# Patient Record
Sex: Female | Born: 1965 | Race: White | Hispanic: No | Marital: Married | Smoking: Former smoker
Health system: Southern US, Community
[De-identification: ages and names within clinical notes are randomized; demographics above are authoritative.]

## PROBLEM LIST (undated history)

## (undated) DIAGNOSIS — G4730 Sleep apnea, unspecified: Secondary | ICD-10-CM

## (undated) DIAGNOSIS — F419 Anxiety disorder, unspecified: Secondary | ICD-10-CM

## (undated) DIAGNOSIS — T7840XA Allergy, unspecified, initial encounter: Secondary | ICD-10-CM

## (undated) HISTORY — DX: Sleep apnea, unspecified: G47.30

## (undated) HISTORY — DX: Anxiety disorder, unspecified: F41.9

## (undated) HISTORY — DX: Allergy, unspecified, initial encounter: T78.40XA

---

## 1998-07-01 ENCOUNTER — Other Ambulatory Visit: Admission: RE | Admit: 1998-07-01 | Discharge: 1998-07-01 | Payer: Self-pay | Admitting: Obstetrics and Gynecology

## 2000-02-13 HISTORY — PX: DILATION AND EVACUATION: SHX1459

## 2000-03-25 ENCOUNTER — Ambulatory Visit (HOSPITAL_COMMUNITY): Admission: RE | Admit: 2000-03-25 | Discharge: 2000-03-25 | Payer: Self-pay | Admitting: Obstetrics and Gynecology

## 2000-03-25 ENCOUNTER — Encounter (INDEPENDENT_AMBULATORY_CARE_PROVIDER_SITE_OTHER): Payer: Self-pay | Admitting: Specialist

## 2000-03-30 ENCOUNTER — Inpatient Hospital Stay (HOSPITAL_COMMUNITY): Admission: AD | Admit: 2000-03-30 | Discharge: 2000-03-30 | Payer: Self-pay | Admitting: Obstetrics and Gynecology

## 2000-03-30 ENCOUNTER — Encounter: Payer: Self-pay | Admitting: Obstetrics and Gynecology

## 2000-10-25 ENCOUNTER — Other Ambulatory Visit: Admission: RE | Admit: 2000-10-25 | Discharge: 2000-10-25 | Payer: Self-pay | Admitting: Obstetrics and Gynecology

## 2001-04-18 ENCOUNTER — Inpatient Hospital Stay (HOSPITAL_COMMUNITY): Admission: AD | Admit: 2001-04-18 | Discharge: 2001-04-18 | Payer: Self-pay | Admitting: Obstetrics and Gynecology

## 2001-05-12 ENCOUNTER — Inpatient Hospital Stay (HOSPITAL_COMMUNITY): Admission: AD | Admit: 2001-05-12 | Discharge: 2001-05-12 | Payer: Self-pay | Admitting: Obstetrics & Gynecology

## 2001-05-14 ENCOUNTER — Inpatient Hospital Stay (HOSPITAL_COMMUNITY): Admission: AD | Admit: 2001-05-14 | Discharge: 2001-05-17 | Payer: Self-pay | Admitting: Obstetrics and Gynecology

## 2001-05-14 ENCOUNTER — Encounter (INDEPENDENT_AMBULATORY_CARE_PROVIDER_SITE_OTHER): Payer: Self-pay

## 2001-06-17 ENCOUNTER — Other Ambulatory Visit: Admission: RE | Admit: 2001-06-17 | Discharge: 2001-06-17 | Payer: Self-pay | Admitting: Obstetrics and Gynecology

## 2001-12-31 ENCOUNTER — Encounter (INDEPENDENT_AMBULATORY_CARE_PROVIDER_SITE_OTHER): Payer: Self-pay | Admitting: Specialist

## 2001-12-31 ENCOUNTER — Ambulatory Visit (HOSPITAL_COMMUNITY): Admission: RE | Admit: 2001-12-31 | Discharge: 2001-12-31 | Payer: Self-pay | Admitting: General Surgery

## 2002-07-23 ENCOUNTER — Other Ambulatory Visit: Admission: RE | Admit: 2002-07-23 | Discharge: 2002-07-23 | Payer: Self-pay | Admitting: Obstetrics and Gynecology

## 2005-10-27 ENCOUNTER — Inpatient Hospital Stay (HOSPITAL_COMMUNITY): Admission: AD | Admit: 2005-10-27 | Discharge: 2005-10-29 | Payer: Self-pay | Admitting: Obstetrics and Gynecology

## 2005-10-31 ENCOUNTER — Ambulatory Visit: Admission: RE | Admit: 2005-10-31 | Discharge: 2005-10-31 | Payer: Self-pay | Admitting: Obstetrics and Gynecology

## 2007-02-13 HISTORY — PX: CYST EXCISION: SHX5701

## 2007-02-26 ENCOUNTER — Encounter: Admission: RE | Admit: 2007-02-26 | Discharge: 2007-02-26 | Payer: Self-pay | Admitting: Obstetrics and Gynecology

## 2007-04-22 ENCOUNTER — Ambulatory Visit (HOSPITAL_COMMUNITY): Admission: RE | Admit: 2007-04-22 | Discharge: 2007-04-22 | Payer: Self-pay | Admitting: Family Medicine

## 2008-03-05 ENCOUNTER — Encounter: Admission: RE | Admit: 2008-03-05 | Discharge: 2008-03-05 | Payer: Self-pay | Admitting: Obstetrics and Gynecology

## 2008-03-11 ENCOUNTER — Encounter: Admission: RE | Admit: 2008-03-11 | Discharge: 2008-03-11 | Payer: Self-pay | Admitting: Obstetrics and Gynecology

## 2009-03-08 ENCOUNTER — Encounter: Admission: RE | Admit: 2009-03-08 | Discharge: 2009-03-08 | Payer: Self-pay | Admitting: Obstetrics and Gynecology

## 2009-05-18 ENCOUNTER — Ambulatory Visit (HOSPITAL_COMMUNITY): Admission: RE | Admit: 2009-05-18 | Discharge: 2009-05-18 | Payer: Self-pay | Admitting: Family Medicine

## 2010-03-05 ENCOUNTER — Encounter: Payer: Self-pay | Admitting: Obstetrics and Gynecology

## 2010-06-30 NOTE — H&P (Signed)
[facility]

## 2010-06-30 NOTE — H&P (Signed)
Virginia Black, Virginia Black                  ACCOUNT NO.:  [id number]   MEDICAL RECORD NO.:  [id number]          PATIENT TYPE:  INP   LOCATION:  9168                          FACILITY:  [facility]   PHYSICIAN:  Lenoard Aden, M.D.DATE OF BIRTH:  1965-08-02   DATE OF ADMISSION:  10/27/2005  DATE OF DISCHARGE:                                HISTORY & PHYSICAL   CHIEF COMPLAINT:  Presumed macrosomia for induction.   HISTORY OF PRESENT ILLNESS:  She is a 45 year old white female, G3, P1, at  38-2/7 weeks with an ultrasound estimated fetal weight to be over nine  pounds, favorable cervix for induction.   MEDICATIONS:  1. Prenatal vitamins.  2. Lexapro.  3. Zyrtec.  4. Occasional use of Extra Strength Tylenol.   ALLERGIES:  No known drug allergies.   PAST MEDICAL HISTORY:  1. History of spontaneous vaginal delivery x1.  2. History of missed AB with D&E x1.   FAMILY HISTORY:  Noncontributory.   SOCIAL HISTORY:  She is a nonsmoker, nondrinker.  She denies domestic or  physical violence.  Pregnancy course otherwise uncomplicated.   PHYSICAL EXAMINATION:  GENERAL:  She is a well-developed, well-nourished  white female in no acute distress.  HEENT:  Normal.  LUNGS:  Clear.  HEART:  Regular rate and rhythm.  ABDOMEN:  Soft, gravid, nontender.  Estimated fetal weight approximately  eight pounds by Leopold's, nine to 9.5 by ultrasound.  PELVIC:  Cervix is 2 to 3 cm, 70%, vertex, -1.  EXTREMITIES:  No cords.  NEUROLOGIC:  Nonfocal.   LABORATORY DATA:  Fetal heart rate tracing is reactive.   IMPRESSION:  1. A 38 week intrauterine pregnancy.  2. Presumed large for gestational age with favorable cervix for induction.   PLAN:  Proceed with Pitocin augmentation and attempted vaginal delivery.  Risks and benefits discussed.      Lenoard Aden, M.D.  Electronically Signed     RJT/MEDQ  D:  10/27/2005  T:  10/27/2005  Job:  [id number]   cc:   Floyde Parkins

## 2010-06-30 NOTE — Op Note (Signed)
[facility]  Patient:    Virginia Black, Virginia Black Visit Number: [id number] MRN: [id number]          Service Type: OBS Location: [facility] Attending Physician:  Genia Del Dictated by:   Lenoard Aden, M.D. Proc. Date: 05/15/01 Admit Date:  05/12/2001                             Operative Report  INDICATIONS:                  Maternal exhaustion, prolonged pushing efforts x2 1/2 hours.  POSTOPERATIVE DIAGNOSES:      Maternal exhaustion, prolonged pushing efforts x2 1/2 hours.  PROCEDURE:                    Outlet vacuum assisted vaginal delivery.  DESCRIPTION OF PROCEDURE:     After being apprised of the risks, benefits of vacuum assisted vaginal delivery, fetal positioning is confirmed at LOA less than 45 degrees +3/+4 station.  MityVac mushroom cup applied for four gentle pulls over a second degree midline episiotomy for atraumatic delivery of a full-term living female, Apgars 8 and 9.  No lacerations are noted.  Placenta is delivered spontaneously intact.  Bulb suction is performed on perineum. Cervix without lacerations.  Vagina without lacerations.  Repaired with a 3-0 Vicryl Rapide without difficulty.  Estimated blood loss 600 cc.  Uterine atone controlled with bimanual massage.  Patient tolerates procedure well.  Placenta is inspected and found to be complete.  Mother and baby recovering in good condition. Dictated by:   Lenoard Aden, M.D. Attending Physician:  Genia Del DD:  05/15/01 TD:  05/15/01 Job: [id number]

## 2010-06-30 NOTE — Op Note (Signed)
[facility]  Patient:    Virginia Black, Virginia Black                         MRN: [id number] Adm. Date:  [id number] Attending:  Silverio Lay A                           Operative Report  PREOPERATIVE DIAGNOSIS:       Missed abortion.  POSTOPERATIVE DIAGNOSIS:      Missed abortion.  PROCEDURE:                    Dilation and evacuation.  SURGEON:                      Silverio Lay, M.D.  ANESTHESIA:                   MAC plus paracervical block.  ESTIMATED BLOOD LOSS:         Minimal.  DESCRIPTION OF PROCEDURE:     After being informed of the planned procedure with the possible complications including bleeding, infection, uterine perforation, the need for laparoscopy, need for laparotomy and retained products of conception needing a second procedure, informed consent was obtained.  The patient was taken to OR #3 and given IV sedation.  She was placed in the lithotomy position, prepped and draped in a sterile fashion. GYN exam revealed a retroverted uterus compatible with a ten-week pregnancy, two normal adnexa.  A weighted speculum was inserted and the anterior lip of the cervix was grasped with a tenaculum.  The uterus was sounded at 11.5 cm. The cervix was dilated using Hegar dilators at #33.  Using a #9 curved cannula the uterine contents were evacuated with suction.  After suction was completed, a sharp curet was used to assess the uterine walls, which were felt to be free of remaining tissue as well as both cornua being free.  The instruments were then removed.  Estimated blood loss was minimal.  The procedure was very well tolerated by the patient.  She was taken to the recovery room in well and stable condition. DD:  03/25/00 TD:  03/26/00 Job: [id number]

## 2010-12-14 ENCOUNTER — Ambulatory Visit (HOSPITAL_COMMUNITY)
Admission: RE | Admit: 2010-12-14 | Discharge: 2010-12-14 | Disposition: A | Discharge status: Home / Self Care | Payer: 59 | Source: Ambulatory Visit | Attending: Family Medicine | Admitting: Family Medicine

## 2010-12-14 ENCOUNTER — Other Ambulatory Visit (HOSPITAL_COMMUNITY): Payer: Self-pay | Admitting: Family Medicine

## 2010-12-14 DIAGNOSIS — S022XXA Fracture of nasal bones, initial encounter for closed fracture: Secondary | ICD-10-CM | POA: Insufficient documentation

## 2010-12-14 DIAGNOSIS — X58XXXA Exposure to other specified factors, initial encounter: Secondary | ICD-10-CM | POA: Insufficient documentation

## 2010-12-14 DIAGNOSIS — R51 Headache: Secondary | ICD-10-CM | POA: Insufficient documentation

## 2011-04-10 ENCOUNTER — Other Ambulatory Visit: Payer: Self-pay | Admitting: Obstetrics and Gynecology

## 2011-04-10 DIAGNOSIS — Z1231 Encounter for screening mammogram for malignant neoplasm of breast: Secondary | ICD-10-CM

## 2011-04-24 ENCOUNTER — Ambulatory Visit
Admission: RE | Admit: 2011-04-24 | Discharge: 2011-04-24 | Disposition: A | Discharge status: Home / Self Care | Payer: 59 | Source: Ambulatory Visit | Attending: Obstetrics and Gynecology | Admitting: Obstetrics and Gynecology

## 2011-04-24 DIAGNOSIS — Z1231 Encounter for screening mammogram for malignant neoplasm of breast: Secondary | ICD-10-CM

## 2011-04-25 ENCOUNTER — Other Ambulatory Visit: Payer: Self-pay | Admitting: Obstetrics and Gynecology

## 2011-04-25 DIAGNOSIS — R928 Other abnormal and inconclusive findings on diagnostic imaging of breast: Secondary | ICD-10-CM

## 2011-05-01 ENCOUNTER — Ambulatory Visit
Admission: RE | Admit: 2011-05-01 | Discharge: 2011-05-01 | Disposition: A | Discharge status: Home / Self Care | Payer: 59 | Source: Ambulatory Visit | Attending: Obstetrics and Gynecology | Admitting: Obstetrics and Gynecology

## 2011-05-01 DIAGNOSIS — R928 Other abnormal and inconclusive findings on diagnostic imaging of breast: Secondary | ICD-10-CM

## 2012-08-19 ENCOUNTER — Other Ambulatory Visit: Payer: Self-pay

## 2012-08-19 DIAGNOSIS — Z1231 Encounter for screening mammogram for malignant neoplasm of breast: Secondary | ICD-10-CM

## 2012-09-09 ENCOUNTER — Ambulatory Visit
Admission: RE | Admit: 2012-09-09 | Discharge: 2012-09-09 | Disposition: A | Discharge status: Home / Self Care | Payer: BC Managed Care – PPO | Source: Ambulatory Visit

## 2012-09-09 DIAGNOSIS — Z1231 Encounter for screening mammogram for malignant neoplasm of breast: Secondary | ICD-10-CM

## 2012-09-18 ENCOUNTER — Other Ambulatory Visit (HOSPITAL_COMMUNITY): Payer: Self-pay

## 2012-09-18 DIAGNOSIS — G4730 Sleep apnea, unspecified: Secondary | ICD-10-CM

## 2012-10-03 ENCOUNTER — Ambulatory Visit: Payer: BC Managed Care – PPO | Attending: Neurology | Admitting: Sleep Medicine

## 2012-10-03 DIAGNOSIS — G4733 Obstructive sleep apnea (adult) (pediatric): Secondary | ICD-10-CM | POA: Insufficient documentation

## 2012-10-03 DIAGNOSIS — G4730 Sleep apnea, unspecified: Secondary | ICD-10-CM

## 2012-10-10 NOTE — Procedures (Signed)
[facility] Virginia Black A. Gerilyn Pilgrim, MD     [email or web address]        [name], [name]                  ACCOUNT NO.:  [id number]  MEDICAL RECORD NO.:  [id number]          PATIENT TYPE:  OUT  LOCATION:  SLEEP LAB                     FACILITY:  [facility]  PHYSICIAN:  Willie Loy A. Gerilyn Pilgrim, M.D. DATE OF BIRTH:  [birth year]  DATE OF STUDY:  10/03/2012                           NOCTURNAL POLYSOMNOGRAM  REFERRING PHYSICIAN:  Corrie Mckusick, M.D.  INDICATION FOR STUDY:  A 47 year old who presents with hypersomnia, fatigue, and snoring.  The study is being done to evaluate for obstructive sleep apnea syndrome.  EPWORTH SLEEPINESS SCORE:  14.  BMI 29.  MEDICATIONS:  None.  SLEEP ARCHITECTURE:  The total recording time is 404 minutes, sleep efficiency is 90%, sleep latency 27 minutes, REM latency 144 minutes. Stage N1 1%, N2 54%, N3 25%, and REM sleep 28%.  RESPIRATORY DATA:  Baseline oxygen saturation is 97, lowest saturation 85 during non-REM sleep.  Diagnostic AHI is 23 and RDI 23.  CARDIAC DATA:  Average heart rate is 70 with no significant dysrhythmias observed.  MOVEMENT-PARASOMNIA:  PLM index 0.  IMPRESSIONS-RECOMMENDATIONS:  Moderate obstructive sleep apnea syndrome. We recommend formal CPAP titration recording.  Thanks for this referral.     Kevin Space A. Gerilyn Pilgrim, M.D.    KAD/MEDQ  D:  10/10/2012 19:44:42  T:  10/10/2012 19:53:55  Job:  [id number]

## 2012-11-07 ENCOUNTER — Other Ambulatory Visit: Payer: Self-pay

## 2012-11-07 DIAGNOSIS — G4730 Sleep apnea, unspecified: Secondary | ICD-10-CM

## 2012-12-05 ENCOUNTER — Ambulatory Visit: Payer: BC Managed Care – PPO | Attending: Neurology | Admitting: Sleep Medicine

## 2012-12-05 DIAGNOSIS — G4730 Sleep apnea, unspecified: Secondary | ICD-10-CM

## 2012-12-05 DIAGNOSIS — G4733 Obstructive sleep apnea (adult) (pediatric): Secondary | ICD-10-CM | POA: Insufficient documentation

## 2012-12-10 NOTE — Procedures (Signed)
[facility] [name] Gerilyn Pilgrim, MD     [email or web address]        Virginia Black, Virginia Black                  ACCOUNT NO.:  [id number]  MEDICAL RECORD NO.:  [id number]          PATIENT TYPE:  OUT  LOCATION:  SLEEP LAB                     FACILITY:  [facility]  PHYSICIAN:  Kele Withem A. Gerilyn Pilgrim, M.D. DATE OF BIRTH:  Nov 23, 1965  DATE OF STUDY:  12/05/2012                           NOCTURNAL POLYSOMNOGRAM  REFERRING PHYSICIAN:  Jasamine Pottinger A. Gerilyn Pilgrim, M.D.  INDICATION:  A 47 year old who has a known history of obstructive sleep apnea syndrome.  This is a CPAP titration recording.  MEDICATIONS:  None.  EPWORTH SLEEPINESS SCALE:  16.  BMI:  29.  ARCHITECTURAL SUMMARY:  The total recording time is 413 minutes.  Sleep efficiency 90%, sleep latency 37 minutes.  REM latency 107 minutes. Stage N1 1%, N2 47%, N3 27% and REM sleep 24%.  RESPIRATORY SUMMARY:  Baseline oxygen saturation is 97, lowest saturation 92 during REM sleep.  The patient was placed on positive pressure starting at 5 and increased to 7, optimal pressure 7 with resolution of events and good tolerance.  LIMB MOVEMENT SUMMARY:  PLM index is 0.  ELECTROCARDIOGRAM SUMMARY:  Average heart rate is 71 with no significant dysrhythmias observed.  IMPRESSION:  Obstructive sleep apnea syndrome which responds well to a CPAP of 7.     Haide Klinker A. Gerilyn Pilgrim, M.D.    KAD/MEDQ  D:  12/10/2012 10:54:20  T:  12/10/2012 11:10:06  Job:  [id number]

## 2014-02-17 ENCOUNTER — Other Ambulatory Visit: Payer: Self-pay

## 2014-02-17 DIAGNOSIS — Z1231 Encounter for screening mammogram for malignant neoplasm of breast: Secondary | ICD-10-CM

## 2014-06-04 ENCOUNTER — Ambulatory Visit
Admission: RE | Admit: 2014-06-04 | Discharge: 2014-06-04 | Disposition: A | Discharge status: Home / Self Care | Payer: BLUE CROSS/BLUE SHIELD | Source: Ambulatory Visit

## 2014-06-04 DIAGNOSIS — Z1231 Encounter for screening mammogram for malignant neoplasm of breast: Secondary | ICD-10-CM

## 2015-10-01 DIAGNOSIS — H524 Presbyopia: Secondary | ICD-10-CM | POA: Diagnosis not present

## 2016-01-04 DIAGNOSIS — G4733 Obstructive sleep apnea (adult) (pediatric): Secondary | ICD-10-CM | POA: Diagnosis not present

## 2016-01-25 ENCOUNTER — Encounter: Payer: BLUE CROSS/BLUE SHIELD | Admitting: Pediatrics

## 2016-01-26 ENCOUNTER — Ambulatory Visit (INDEPENDENT_AMBULATORY_CARE_PROVIDER_SITE_OTHER): Payer: BLUE CROSS/BLUE SHIELD | Admitting: Pediatrics

## 2016-01-26 ENCOUNTER — Encounter: Payer: Self-pay | Admitting: Pediatrics

## 2016-01-26 VITALS — BP 109/69 | HR 67 | Temp 97.5°F | Ht 68.0 in | Wt 196.0 lb

## 2016-01-26 DIAGNOSIS — Z0000 Encounter for general adult medical examination without abnormal findings: Secondary | ICD-10-CM | POA: Diagnosis not present

## 2016-01-26 DIAGNOSIS — Z23 Encounter for immunization: Secondary | ICD-10-CM

## 2016-01-26 DIAGNOSIS — F419 Anxiety disorder, unspecified: Secondary | ICD-10-CM

## 2016-01-26 MED ORDER — BUPROPION HCL ER (XL) 150 MG PO TB24: 150.0000 mg | ORAL_TABLET | Freq: Every day | ORAL | 11 refills | Status: DC

## 2016-01-26 MED ORDER — ESCITALOPRAM OXALATE 20 MG PO TABS: 20.0000 mg | ORAL_TABLET | Freq: Every day | ORAL | 11 refills | Status: DC

## 2016-01-26 NOTE — Progress Notes (Signed)
  Subjective:   Patient ID: Virginia Black, female    DOB: [birth year], 50 y.o.   MRN: [id number] CC: New Patient (Initial Visit)  HPI: Virginia Black is a 50 y.o. female presenting for New Patient (Initial Visit)  OSA: CPAP daily, has helped a lot  Anxiety: on lexapro, has been for six months, has been helping a lot Tried celexa in the past, caused weight gain Thinks it has been helping well Was also on wellbutrin at the same time at one point, felt calmer when on both  No chest pain, no SOB Starting to have irregular periods hasnt had period in 3 mo No hot flashes  Pap smear over a year ago, has never had abnormals, gets regularly  Mammogram done at breast center in [place]  Family History  Problem Relation Age of Onset  . Hyperlipidemia Mother   . Hyperlipidemia Father   no fam hx of breast ca, colon ca MGM in CVA Mother: high BP Maternal Aunt: Dm2  Social History   Social History  . Marital status: Married    Spouse name: N/A  . Number of children: N/A  . Years of education: N/A   Social History Main Topics  . Smoking status: Never Smoker  . Smokeless tobacco: Never Used  . Alcohol use 4.8 oz/week    8 Glasses of wine per week  . Drug use: No  . Sexual activity: Yes   Other Topics Concern  . None   Social History Narrative  . None   ROS: All systems negative other than what is in HPI  Objective:    BP 109/69   Pulse 67   Temp 97.5 F (36.4 C) (Oral)   Ht '5\' 8"'$  (1.727 m)   Wt 196 lb (88.9 kg)   BMI 29.80 kg/m   Wt Readings from Last 3 Encounters:  01/26/16 196 lb (88.9 kg)    Gen: NAD, alert, cooperative with exam, NCAT EYES: EOMI, no conjunctival injection, or no icterus ENT:  TMs pearly gray b/l, OP without erythema LYMPH: no cervical LAD CV: NRRR, normal S1/S2, no murmur, distal pulses 2+ b/l Resp: CTABL, no wheezes, normal WOB Abd: +BS, soft, NTND. no guarding or organomegaly Ext: No edema, warm Neuro: Alert and oriented, strength equal b/l UE  and LE, coordination grossly normal MSK: normal muscle bulk  Assessment & Plan:  Virginia Black was seen today for new patient (initial visit).  Diagnoses and all orders for this visit:  Encounter for preventive health examination -     Ambulatory referral to Gastroenterology in [place] for colonoscopy -     CMP14+EGFR -     Lipid panel -     CBC with Differential/Platelet  Anxiety Felt better when on both wellbutrin and lexapro May be able to decrease lexapro dose to '10mg'$ , had fewere sexual side effects then as well Restart wellbutrin Decrease to '10mg'$  lexapro if able -     buPROPion (WELLBUTRIN XL) 150 MG 24 hr tablet; Take 1 tablet (150 mg total) by mouth daily. -     escitalopram (LEXAPRO) 20 MG tablet; Take 1 tablet (20 mg total) by mouth daily.  Encounter for immunization -     Flu Vaccine QUAD 36+ mos IM   Follow up plan: 1 yr Assunta Found, MD [place]

## 2016-01-27 LAB — CBC WITH DIFFERENTIAL/PLATELET
Basophils Absolute: 0.1 10*3/uL (ref 0.0–0.2)
Basos: 1 %
EOS (ABSOLUTE): 0.3 10*3/uL (ref 0.0–0.4)
Eos: 3 %
Hematocrit: 39.8 % (ref 34.0–46.6)
Hemoglobin: 13.7 g/dL (ref 11.1–15.9)
Immature Grans (Abs): 0 10*3/uL (ref 0.0–0.1)
Immature Granulocytes: 0 %
Lymphocytes Absolute: 2.6 10*3/uL (ref 0.7–3.1)
Lymphs: 26 %
MCH: 29.7 pg (ref 26.6–33.0)
MCHC: 34.4 g/dL (ref 31.5–35.7)
MCV: 86 fL (ref 79–97)
Monocytes Absolute: 0.8 10*3/uL (ref 0.1–0.9)
Monocytes: 8 %
Neutrophils Absolute: 6.3 10*3/uL (ref 1.4–7.0)
Neutrophils: 62 %
Platelets: 330 10*3/uL (ref 150–379)
RBC: 4.62 x10E6/uL (ref 3.77–5.28)
RDW: 14.5 % (ref 12.3–15.4)
WBC: 10 10*3/uL (ref 3.4–10.8)

## 2016-01-27 LAB — LIPID PANEL
Chol/HDL Ratio: 2.9 ratio units (ref 0.0–4.4)
Cholesterol, Total: 229 mg/dL — ABNORMAL HIGH (ref 100–199)
HDL: 79 mg/dL (ref >39)
LDL Calculated: 126 mg/dL — ABNORMAL HIGH (ref 0–99)
Triglycerides: 121 mg/dL (ref 0–149)
VLDL Cholesterol Cal: 24 mg/dL (ref 5–40)

## 2016-01-27 LAB — CMP14+EGFR
ALT: 15 IU/L (ref 0–32)
AST: 17 IU/L (ref 0–40)
Albumin/Globulin Ratio: 1.5 (ref 1.2–2.2)
Albumin: 4.3 g/dL (ref 3.5–5.5)
Alkaline Phosphatase: 82 IU/L (ref 39–117)
BUN/Creatinine Ratio: 19 (ref 9–23)
BUN: 13 mg/dL (ref 6–24)
Bilirubin Total: 0.4 mg/dL (ref 0.0–1.2)
CO2: 28 mmol/L (ref 18–29)
Calcium: 8.8 mg/dL (ref 8.7–10.2)
Chloride: 99 mmol/L (ref 96–106)
Creatinine, Ser: 0.69 mg/dL (ref 0.57–1.00)
GFR calc Af Amer: 117 mL/min/{1.73_m2} (ref >59)
GFR calc non Af Amer: 102 mL/min/{1.73_m2} (ref >59)
Globulin, Total: 2.9 g/dL (ref 1.5–4.5)
Glucose: 86 mg/dL (ref 65–99)
Potassium: 4.7 mmol/L (ref 3.5–5.2)
Sodium: 144 mmol/L (ref 134–144)
Total Protein: 7.2 g/dL (ref 6.0–8.5)

## 2016-06-23 ENCOUNTER — Encounter: Payer: Self-pay | Admitting: Nurse Practitioner

## 2016-06-23 ENCOUNTER — Ambulatory Visit (INDEPENDENT_AMBULATORY_CARE_PROVIDER_SITE_OTHER): Payer: BLUE CROSS/BLUE SHIELD | Admitting: Nurse Practitioner

## 2016-06-23 VITALS — BP 102/60 | HR 72 | Temp 97.7°F | Ht 68.0 in | Wt 202.6 lb

## 2016-06-23 DIAGNOSIS — H6123 Impacted cerumen, bilateral: Secondary | ICD-10-CM

## 2016-06-23 NOTE — Patient Instructions (Signed)
Earwax Buildup Your ears make a substance called earwax. It may also be called cerumen. Sometimes, too much earwax builds up in your ear canal. This can cause ear pain and make it harder for you to hear. CAUSES This condition is caused by too much earwax production or buildup. RISK FACTORS The following factors may make you more likely to develop this condition:  Cleaning your ears often with swabs.  Having narrow ear canals.  Having earwax that is overly thick or sticky.  Having eczema.  Being dehydrated. SYMPTOMS Symptoms of this condition include:  Reduced hearing.  Ear drainage.  Ear pain.  Ear itch.  A feeling of fullness in the ear or feeling that the ear is plugged.  Ringing in the ear.  Coughing. DIAGNOSIS Your health care provider can diagnose this condition based on your symptoms and medical history. Your health care provider will also do an ear exam to look inside your ear with a scope (otoscope). You may also have a hearing test. TREATMENT Treatment for this condition includes:  Over-the-counter or prescription ear drops to soften the earwax.  Earwax removal by a health care provider. This may be done:  By flushing the ear with body-temperature water.  With a medical instrument that has a loop at the end (earwax curette).  With a suction device. HOME CARE INSTRUCTIONS  Take over-the-counter and prescription medicines only as told by your health care provider.  Do not put any objects, including an ear swab, into your ear. You can clean the opening of your ear canal with a washcloth.  Drink enough water to keep your urine clear or pale yellow.  If you have frequent earwax buildup or you use hearing aids, consider seeing your health care provider every 6-12 months for routine preventive ear cleanings. Keep all follow-up visits as told by your health care provider. SEEK MEDICAL CARE IF:  You have ear pain.  Your condition does not improve with  treatment.  You have hearing loss.  You have blood, pus, or other fluid coming from your ear. This information is not intended to replace advice given to you by your health care provider. Make sure you discuss any questions you have with your health care provider. Document Released: 03/08/2004 Document Revised: 05/23/2015 Document Reviewed: 09/15/2014 Elsevier Interactive Patient Education  2017 [employer].

## 2016-06-23 NOTE — Progress Notes (Signed)
   Subjective:    Patient ID: Virginia Black, female    DOB: 1965-10-24, 51 y.o.   MRN: [id number]  HPI  Patient comes in today c/o left ear pain and feels clogged up. Started about 3 days ago. Hearing has decreased some.   Review of Systems  Constitutional: Negative.   HENT: Positive for ear pain. Negative for ear discharge.   Respiratory: Negative.   Gastrointestinal: Negative.   Genitourinary: Negative.   Neurological: Negative.   Psychiatric/Behavioral: Negative.   All other systems reviewed and are negative.      Objective:   Physical Exam  Constitutional: She is oriented to person, place, and time. She appears well-developed and well-nourished. No distress.  HENT:  Right Ear: A foreign body (cerumen impaction) is present.  Left Ear: A foreign body (cerumen impaction) is present.  Nose: Nose normal. No mucosal edema or rhinorrhea. Right sinus exhibits no maxillary sinus tenderness and no frontal sinus tenderness. Left sinus exhibits no maxillary sinus tenderness and no frontal sinus tenderness.  Mouth/Throat: Uvula is midline, oropharynx is clear and moist and mucous membranes are normal.  Cardiovascular: Normal rate and regular rhythm.   Pulmonary/Chest: Effort normal and breath sounds normal.  Neurological: She is alert and oriented to person, place, and time.  Skin: Skin is warm.   BP 102/60   Pulse 72   Temp 97.7 F (36.5 C) (Oral)   Ht 5\' 8"  (1.727 m)   Wt 202 lb 9.6 oz (91.9 kg)   BMI 30.81 kg/m   S/P bil ear irrigation TM's mild clear effusion- no erythema or Gerre Scull, FNP       Assessment & Plan:   1. Bilateral hearing loss due to cerumen impaction    Ear irrigation dbrox 2-3 x a week OTC decongestant RTO prn  Mary-Margaret Hassell Done, FNP

## 2016-07-12 ENCOUNTER — Telehealth: Payer: Self-pay | Admitting: Pediatrics

## 2016-10-18 IMAGING — MG MM SCREEN MAMMOGRAM BILATERAL
4 series · 4 of 4 positions shown · non-contrast
Comparison: Previous exam(s).

CLINICAL DATA: Screening.

EXAM:
DIGITAL SCREENING BILATERAL MAMMOGRAM WITH CAD

[R CC]
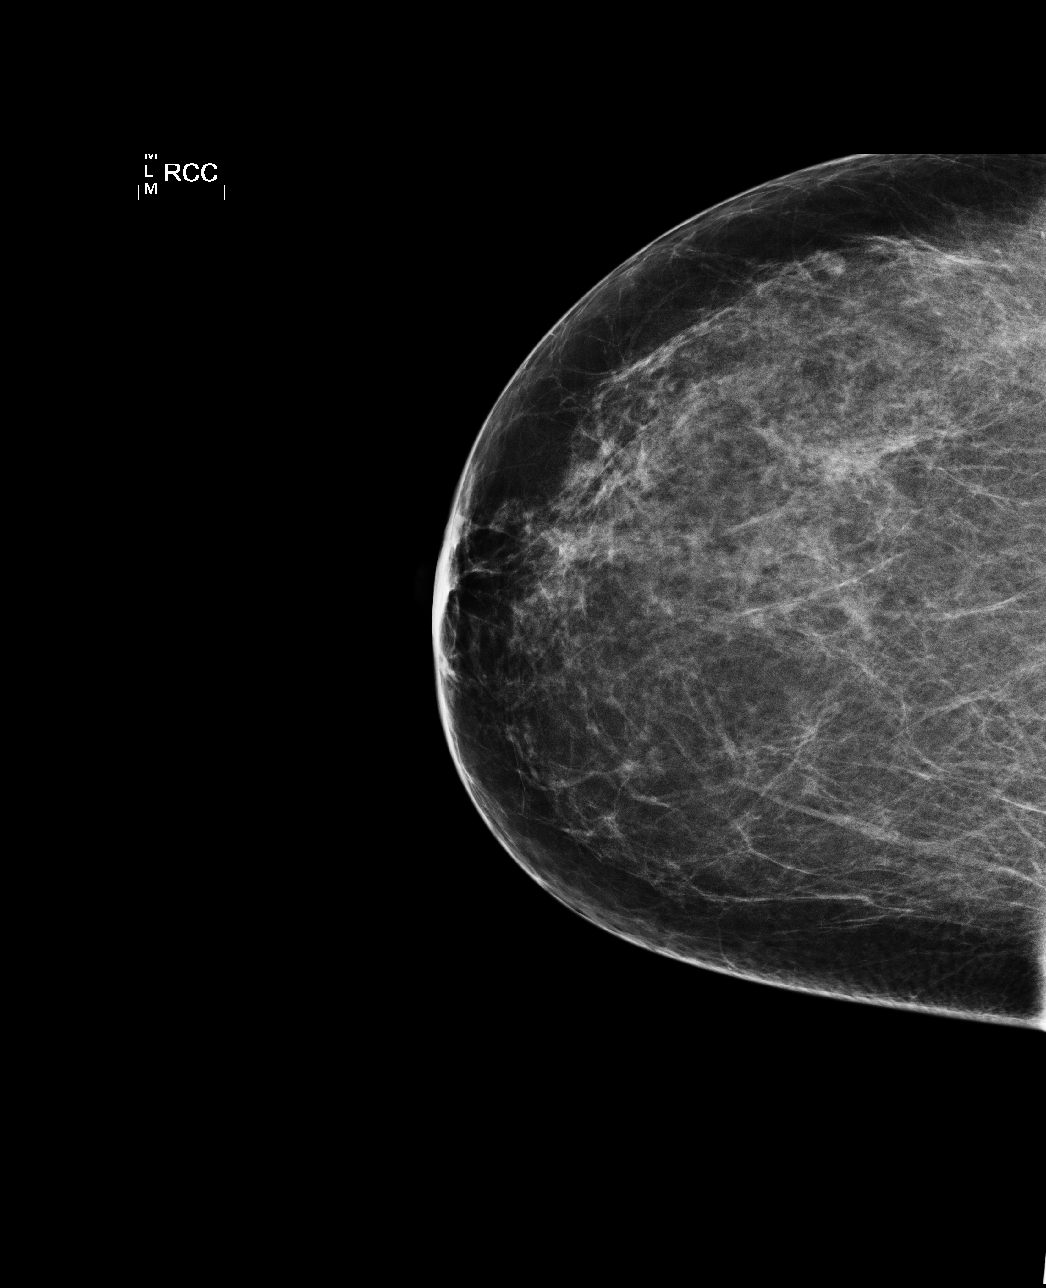

[L CC]
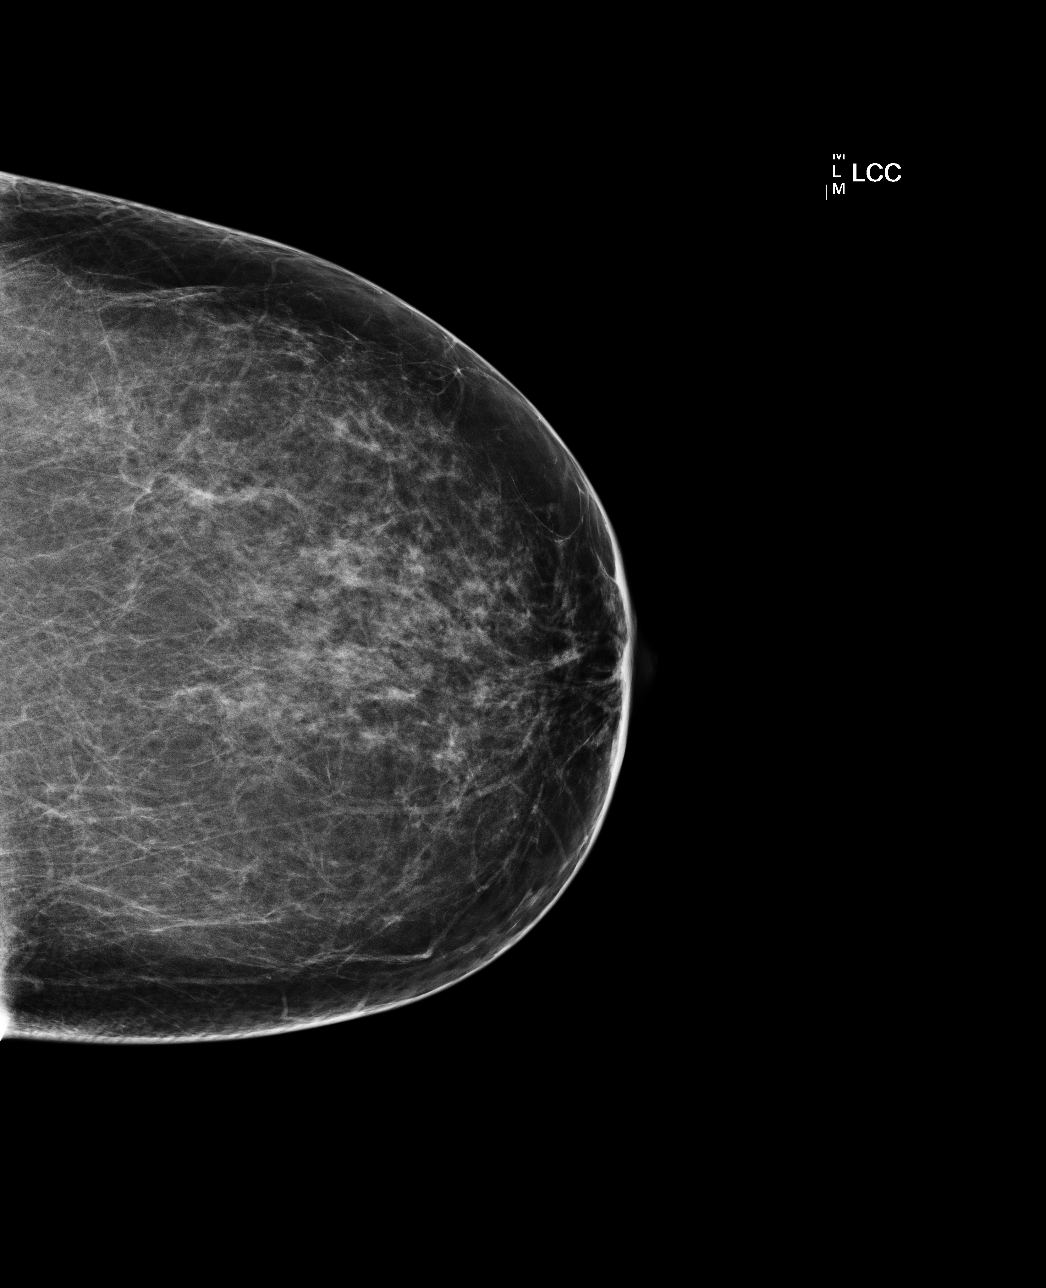

[L MLO]
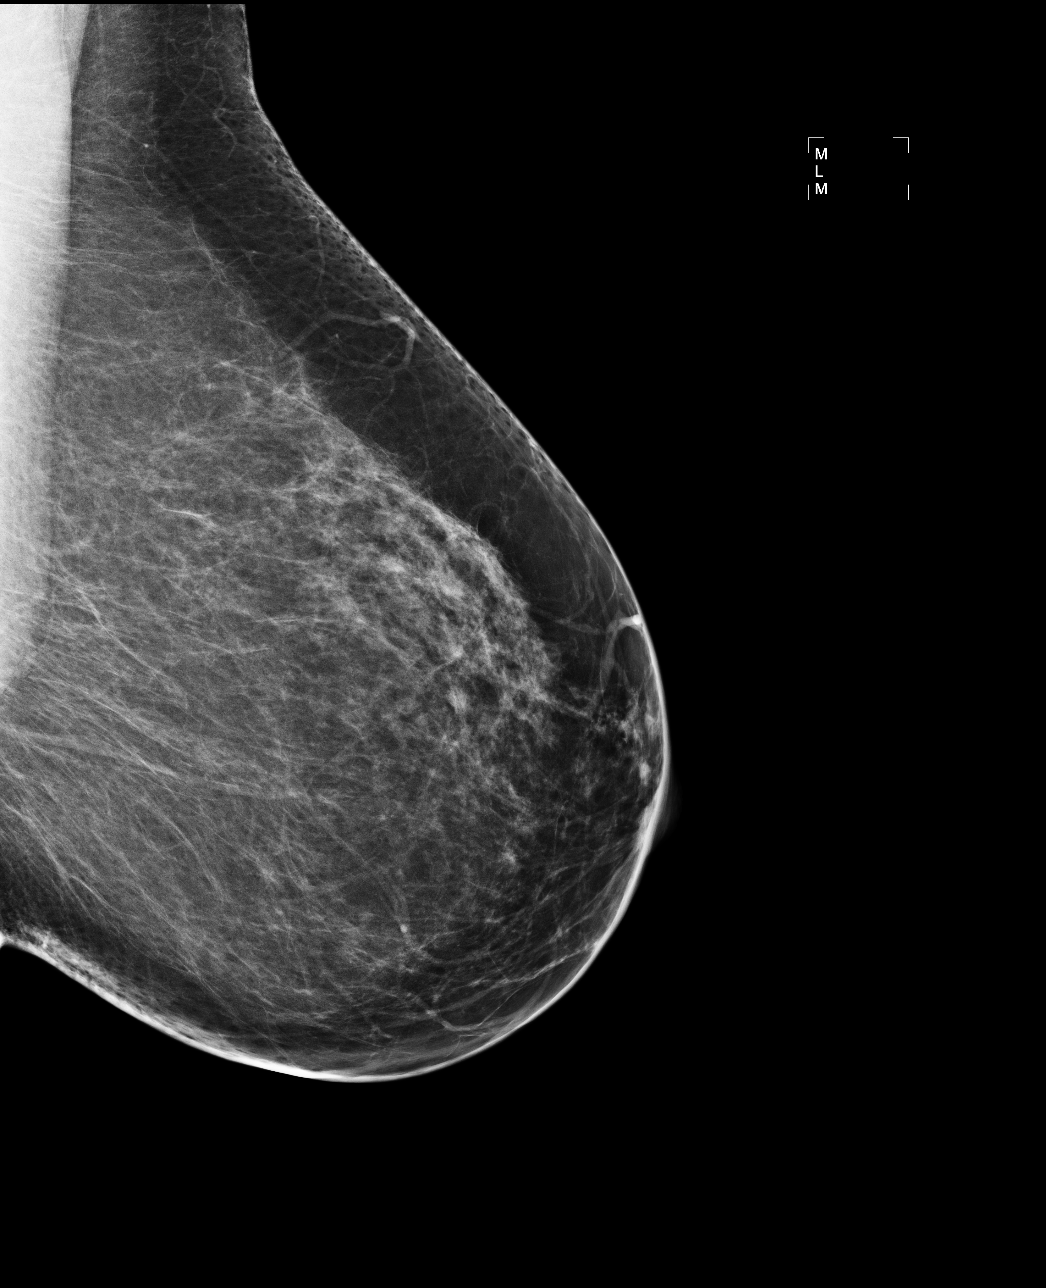

[R MLO]
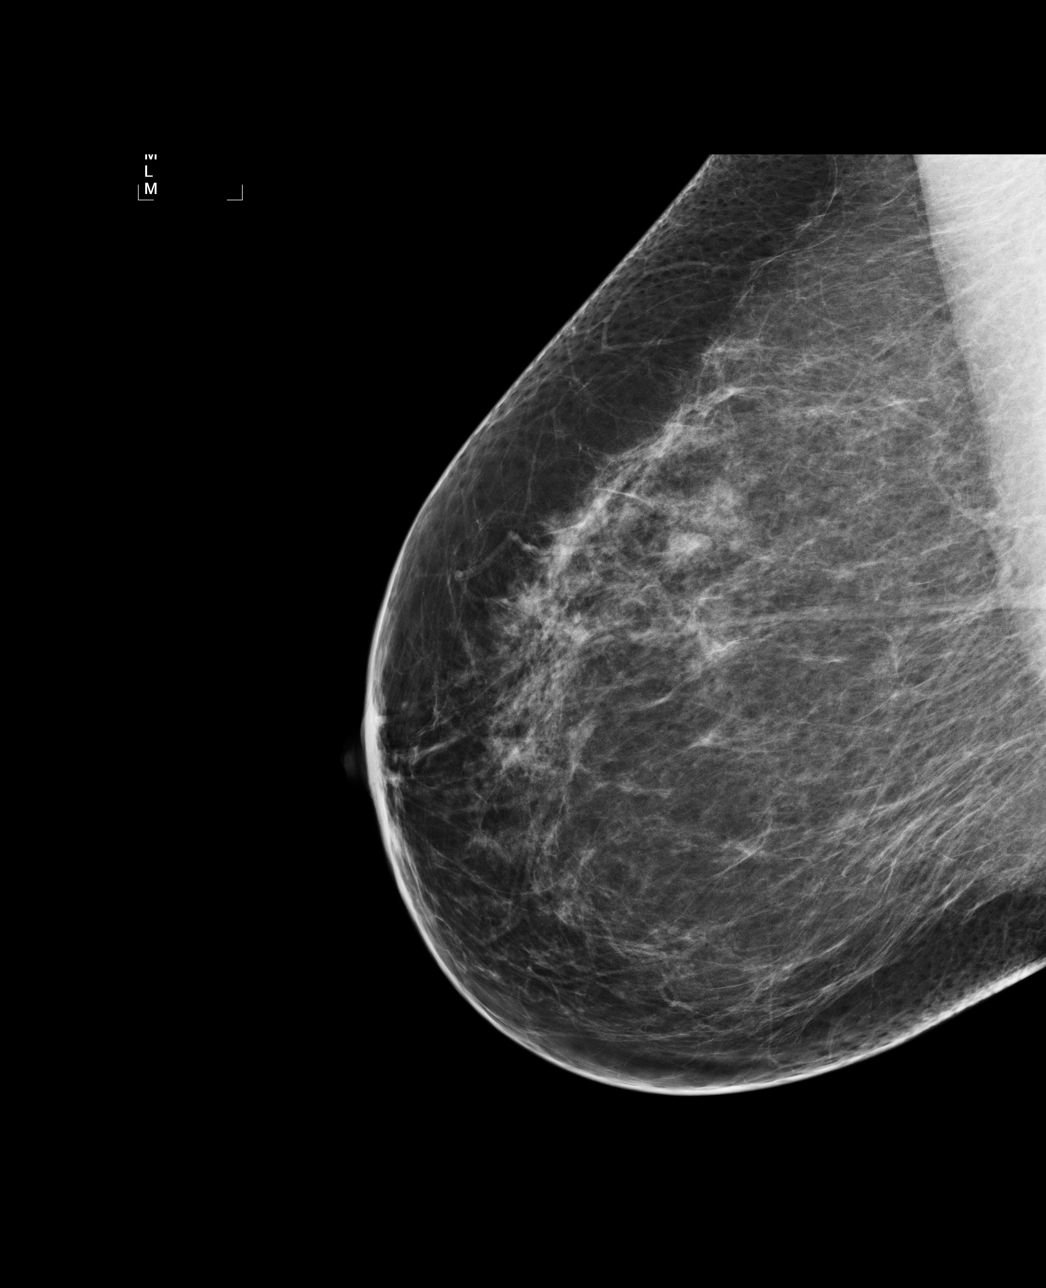

[4 of 4 positions shown; findings below may reference images not displayed]

ACR Breast Density Category c: The breast tissue is heterogeneously
dense, which may obscure small masses.
FINDINGS: There are no findings suspicious for malignancy. Images were
processed with CAD.
IMPRESSION: No mammographic evidence of malignancy. A result letter of this
screening mammogram will be mailed directly to the patient.

RECOMMENDATION:
Screening mammogram in one year. (Code:YJ-[id number])

BI-RADS CATEGORY  1: Negative.

## 2017-01-01 DIAGNOSIS — Z1231 Encounter for screening mammogram for malignant neoplasm of breast: Secondary | ICD-10-CM | POA: Diagnosis not present

## 2017-01-28 ENCOUNTER — Encounter (INDEPENDENT_AMBULATORY_CARE_PROVIDER_SITE_OTHER): Payer: Self-pay

## 2017-01-28 ENCOUNTER — Encounter: Payer: Self-pay | Admitting: Pediatrics

## 2017-01-28 ENCOUNTER — Ambulatory Visit (INDEPENDENT_AMBULATORY_CARE_PROVIDER_SITE_OTHER): Payer: BLUE CROSS/BLUE SHIELD | Admitting: Pediatrics

## 2017-01-28 VITALS — BP 116/73 | HR 69 | Temp 97.1°F | Ht 68.0 in | Wt 200.6 lb

## 2017-01-28 DIAGNOSIS — G4700 Insomnia, unspecified: Secondary | ICD-10-CM

## 2017-01-28 DIAGNOSIS — E669 Obesity, unspecified: Secondary | ICD-10-CM

## 2017-01-28 DIAGNOSIS — Z6830 Body mass index (BMI) 30.0-30.9, adult: Secondary | ICD-10-CM | POA: Diagnosis not present

## 2017-01-28 DIAGNOSIS — F339 Major depressive disorder, recurrent, unspecified: Secondary | ICD-10-CM

## 2017-01-28 DIAGNOSIS — Z23 Encounter for immunization: Secondary | ICD-10-CM

## 2017-01-28 DIAGNOSIS — M79671 Pain in right foot: Secondary | ICD-10-CM

## 2017-01-28 DIAGNOSIS — Z0000 Encounter for general adult medical examination without abnormal findings: Secondary | ICD-10-CM

## 2017-01-28 DIAGNOSIS — Z1211 Encounter for screening for malignant neoplasm of colon: Secondary | ICD-10-CM

## 2017-01-28 MED ORDER — DULOXETINE HCL 20 MG PO CPEP: 20.0000 mg | ORAL_CAPSULE | Freq: Every day | ORAL | 2 refills | Status: DC

## 2017-01-28 NOTE — Progress Notes (Signed)
Subjective:   Patient ID: Virginia Black, female    DOB: [birth year], 51 y.o.   MRN: [id number] CC: Annual Exam and multiple med problems. HPI: Virginia Black is a 51 y.o. female presenting for Annual Exam  Younger son has ADHD, ongoing stress at home Moods up and down, no periods for over a year Intercourse once a month, no pain, seeing counselor starting this past week, low libido On lexapro now, patient does not think that it is helping much Going to sleep 11-12a, up at [place] of the family goes to bed by 10 Patient says she stays up watching TV because she cannot get to sleep  Last mammogram done within the last month with [facility]  Due for colonoscopy  No fam h/o colon or breast  Has been bothered by right heel pain over the last 4 months Limits her ability to walk She put better arch supports in her shoes that she wears every day with some improvement, still enough pain and discomfort that she is not able to regularly exercise Using tennis ball on her arch, trying to stretch her plantar fascia  Depression screen [place] 2/9 01/28/2017 06/23/2016 01/26/2016  Decreased Interest 0 0 0  Down, Depressed, Hopeless 0 0 0  PHQ - 2 Score 0 0 0     Relevant past medical, surgical, family and social history reviewed. Allergies and medications reviewed and updated. Social History   Tobacco Use  Smoking Status Never Smoker  Smokeless Tobacco Never Used   ROS: All systems negative other than what is in the HPI  Objective:    BP 116/73   Pulse 69   Temp (!) 97.1 F (36.2 C) (Oral)   Ht 5\' 8"  (1.727 m)   Wt 200 lb 9.6 oz (91 kg)   BMI 30.50 kg/m   Wt Readings from Last 3 Encounters:  01/28/17 200 lb 9.6 oz (91 kg)  06/23/16 202 lb 9.6 oz (91.9 kg)  01/26/16 196 lb (88.9 kg)    Gen: NAD, alert, cooperative with exam, NCAT EYES: EOMI, no conjunctival injection, or no icterus ENT:  TMs pearly gray b/l, OP without erythema LYMPH: no cervical LAD CV: NRRR, normal S1/S2, no  murmur, distal pulses 2+ b/l Resp: CTABL, no wheezes, normal WOB Abd: +BS, soft, NTND. no guarding or organomegaly Ext: No edema, warm Neuro: Alert and oriented, strength equal b/l UE and LE, coordination grossly normal MSK: Right foot with high arches, normal range of motion, no swelling, no pain with Achilles tendon squeeze, pain with lower heel squeeze, tender insertion of plantar fascia right foot Psych: Normal affect  Assessment & Plan:  Virginia Black was seen today for annual exam and multiple medical problems  Diagnoses and all orders for this visit:  Encounter for preventive health examination Recent mammogram within the right breast here at clinic, not yet in the computer Patient reports his normal Due for Pap smear next summer for patient  Depression, recurrent ([place]) Decreased libido with the Lexapro 10mg , will try switching to Cymbalta Mood has been down In counseling, couple's counseling now but she thinks they will be meeting individually with a counselor in the future -     DULoxetine (CYMBALTA) 20 MG capsule; Take 1 capsule (20 mg total) by mouth daily.  Colon cancer screening -     Ambulatory referral to Gastroenterology  Pain of right heel -     Ambulatory referral to Podiatry  Need for immunization against influenza -     Flu  Vaccine QUAD 36+ mos IM  Insomnia, unspecified type Discussed sleep hygiene, turning screens off 1-2 hours before going to bed Okay to try melatonin We will try to get heel pain improved so patient can regularly exercise again  Class 1 obesity without serious comorbidity with body mass index (BMI) of 30.0 to 30.9 in adult, unspecified obesity type Discussed lifestyle changes, patient mostly eating at home, avoiding sugary foods Increase physical activity as able  Follow up plan: 3 mo, sooner prn Assunta Found, MD [place]

## 2017-02-02 ENCOUNTER — Other Ambulatory Visit: Payer: BLUE CROSS/BLUE SHIELD

## 2017-02-02 ENCOUNTER — Other Ambulatory Visit: Payer: Self-pay | Admitting: Pediatrics

## 2017-02-02 DIAGNOSIS — Z0000 Encounter for general adult medical examination without abnormal findings: Secondary | ICD-10-CM

## 2017-02-02 DIAGNOSIS — F419 Anxiety disorder, unspecified: Secondary | ICD-10-CM

## 2017-02-03 LAB — CMP14+EGFR
ALT: 16 IU/L (ref 0–32)
AST: 17 IU/L (ref 0–40)
Albumin/Globulin Ratio: 1.6 (ref 1.2–2.2)
Albumin: 4.2 g/dL (ref 3.5–5.5)
Alkaline Phosphatase: 81 IU/L (ref 39–117)
BUN/Creatinine Ratio: 12 (ref 9–23)
BUN: 10 mg/dL (ref 6–24)
Bilirubin Total: 0.3 mg/dL (ref 0.0–1.2)
CO2: 27 mmol/L (ref 20–29)
Calcium: 8.6 mg/dL — ABNORMAL LOW (ref 8.7–10.2)
Chloride: 101 mmol/L (ref 96–106)
Creatinine, Ser: 0.83 mg/dL (ref 0.57–1.00)
GFR calc Af Amer: 94 mL/min/{1.73_m2} (ref >59)
GFR calc non Af Amer: 82 mL/min/{1.73_m2} (ref >59)
Globulin, Total: 2.6 g/dL (ref 1.5–4.5)
Glucose: 89 mg/dL (ref 65–99)
Potassium: 5 mmol/L (ref 3.5–5.2)
Sodium: 144 mmol/L (ref 134–144)
Total Protein: 6.8 g/dL (ref 6.0–8.5)

## 2017-02-03 LAB — CBC WITH DIFFERENTIAL/PLATELET
Basophils Absolute: 0.1 10*3/uL (ref 0.0–0.2)
Basos: 1 %
EOS (ABSOLUTE): 0.2 10*3/uL (ref 0.0–0.4)
Eos: 3 %
Hematocrit: 40.3 % (ref 34.0–46.6)
Hemoglobin: 13.8 g/dL (ref 11.1–15.9)
Immature Grans (Abs): 0 10*3/uL (ref 0.0–0.1)
Immature Granulocytes: 0 %
Lymphocytes Absolute: 2.5 10*3/uL (ref 0.7–3.1)
Lymphs: 31 %
MCH: 30.7 pg (ref 26.6–33.0)
MCHC: 34.2 g/dL (ref 31.5–35.7)
MCV: 90 fL (ref 79–97)
Monocytes Absolute: 0.5 10*3/uL (ref 0.1–0.9)
Monocytes: 7 %
Neutrophils Absolute: 4.6 10*3/uL (ref 1.4–7.0)
Neutrophils: 58 %
Platelets: 347 10*3/uL (ref 150–379)
RBC: 4.49 x10E6/uL (ref 3.77–5.28)
RDW: 13.2 % (ref 12.3–15.4)
WBC: 7.9 10*3/uL (ref 3.4–10.8)

## 2017-02-03 LAB — LIPID PANEL
Chol/HDL Ratio: 3 ratio (ref 0.0–4.4)
Cholesterol, Total: 197 mg/dL (ref 100–199)
HDL: 65 mg/dL (ref >39)
LDL Calculated: 106 mg/dL — ABNORMAL HIGH (ref 0–99)
Triglycerides: 131 mg/dL (ref 0–149)
VLDL Cholesterol Cal: 26 mg/dL (ref 5–40)

## 2017-02-06 ENCOUNTER — Telehealth: Payer: Self-pay | Admitting: Pediatrics

## 2017-02-06 MED ORDER — BUPROPION HCL ER (XL) 150 MG PO TB24: 150.0000 mg | ORAL_TABLET | Freq: Every day | ORAL | 0 refills | Status: DC

## 2017-02-06 NOTE — Telephone Encounter (Signed)
Left message- rx requested was sent in on 12/14.

## 2017-02-06 NOTE — Telephone Encounter (Signed)
Rx sent in since patient was seen 01/28/17.

## 2017-02-06 NOTE — Telephone Encounter (Signed)
buPROPion (WELLBUTRIN XL) 150 MG 24 hr tablet was sent in on 01/26/16 not 2018. Please advise. Pt upset only has 2 left.

## 2017-02-11 ENCOUNTER — Ambulatory Visit (INDEPENDENT_AMBULATORY_CARE_PROVIDER_SITE_OTHER): Payer: BLUE CROSS/BLUE SHIELD | Admitting: Podiatry

## 2017-02-11 ENCOUNTER — Ambulatory Visit (INDEPENDENT_AMBULATORY_CARE_PROVIDER_SITE_OTHER): Payer: BLUE CROSS/BLUE SHIELD

## 2017-02-11 ENCOUNTER — Encounter: Payer: Self-pay | Admitting: Podiatry

## 2017-02-11 DIAGNOSIS — M722 Plantar fascial fibromatosis: Secondary | ICD-10-CM

## 2017-02-11 MED ORDER — MELOXICAM 15 MG PO TABS: 15.0000 mg | ORAL_TABLET | Freq: Every day | ORAL | 1 refills | Status: AC

## 2017-02-16 NOTE — Progress Notes (Signed)
   Subjective: 52 year old female presents today as a new patient for pain and tenderness in the right plantar heel that began about 6 months ago. Patient states that it hurts in the mornings with the first steps out of bed.  There are no alleviating factors noted.  She has not had anything for treatment.  Patient presents today for further treatment and evaluation.   No past medical history on file.   Objective: Physical Exam General: The patient is alert and oriented x3 in no acute distress.  Dermatology: Skin is warm, dry and supple bilateral lower extremities. Negative for open lesions or macerations bilateral.   Vascular: Dorsalis Pedis and Posterior Tibial pulses palpable bilateral.  Capillary fill time is immediate to all digits.  Neurological: Epicritic and protective threshold intact bilateral.   Musculoskeletal: Tenderness to palpation at the medial calcaneal tubercale and through the insertion of the plantar fascia of the right foot. All other joints range of motion within normal limits bilateral. Strength 5/5 in all groups bilateral.   Radiographic exam: Normal osseous mineralization. Joint spaces preserved. No fracture/dislocation/boney destruction. Calcaneal spur present with mild thickening of plantar fascia right. No other soft tissue abnormalities or radiopaque foreign bodies.   Assessment: 1. Plantar fasciitis right 2. Pain in right foot 3.  History of left second toe fracture  Plan of Care:  1. Patient evaluated. Xrays reviewed.   2. Injection of 0.5cc Celestone soluspan injected into the right plantar fascia  3. Rx for meloxicam placed 4. Recommended OTC insoles for non-orthotics department, Dawn, will verify orthotics benefits. 5. Plantar fascial band(s) dispensed 6. Instructed patient regarding therapies and modalities at home to alleviate symptoms.  7. Return to clinic in 4 weeks.     Edrick Kins, DPM [facility]  Dr. Edrick Kins,  DPM    [address], [place] [address]                Office [phone]  Fax [phone]

## 2017-02-18 ENCOUNTER — Telehealth: Payer: Self-pay | Admitting: Podiatry

## 2017-02-18 NOTE — Telephone Encounter (Signed)
Left message for pt to call to discuss insurance covereage for orthotics.per orders Jenn at [facility] terminated 12.31.18

## 2017-02-28 ENCOUNTER — Encounter: Payer: Self-pay | Admitting: Gastroenterology

## 2017-03-07 ENCOUNTER — Encounter: Payer: Self-pay | Admitting: Gastroenterology

## 2017-03-11 ENCOUNTER — Ambulatory Visit: Payer: BLUE CROSS/BLUE SHIELD | Admitting: Podiatry

## 2017-03-28 ENCOUNTER — Ambulatory Visit (INDEPENDENT_AMBULATORY_CARE_PROVIDER_SITE_OTHER): Payer: BLUE CROSS/BLUE SHIELD | Admitting: Pediatrics

## 2017-03-28 ENCOUNTER — Encounter: Payer: Self-pay | Admitting: Pediatrics

## 2017-03-28 VITALS — BP 139/71 | HR 76 | Temp 99.0°F | Ht 68.0 in | Wt 198.6 lb

## 2017-03-28 DIAGNOSIS — F419 Anxiety disorder, unspecified: Secondary | ICD-10-CM | POA: Diagnosis not present

## 2017-03-28 DIAGNOSIS — F339 Major depressive disorder, recurrent, unspecified: Secondary | ICD-10-CM

## 2017-03-28 DIAGNOSIS — R52 Pain, unspecified: Secondary | ICD-10-CM

## 2017-03-28 DIAGNOSIS — R509 Fever, unspecified: Secondary | ICD-10-CM | POA: Diagnosis not present

## 2017-03-28 DIAGNOSIS — J029 Acute pharyngitis, unspecified: Secondary | ICD-10-CM

## 2017-03-28 DIAGNOSIS — J101 Influenza due to other identified influenza virus with other respiratory manifestations: Secondary | ICD-10-CM | POA: Diagnosis not present

## 2017-03-28 LAB — RAPID STREP SCREEN (MED CTR MEBANE ONLY): Strep Gp A Ag, IA W/Reflex: NEGATIVE

## 2017-03-28 LAB — VERITOR FLU A/B WAIVED
Influenza A: POSITIVE — AB
Influenza B: NEGATIVE

## 2017-03-28 LAB — CULTURE, GROUP A STREP

## 2017-03-28 MED ORDER — OSELTAMIVIR PHOSPHATE 75 MG PO CAPS
75.0000 mg | ORAL_CAPSULE | Freq: Two times a day (BID) | ORAL | 0 refills | Status: DC
Start: 2017-03-28 — End: 2017-04-10

## 2017-03-28 MED ORDER — BUPROPION HCL ER (XL) 150 MG PO TB24: 150.0000 mg | ORAL_TABLET | Freq: Every day | ORAL | 5 refills | Status: DC

## 2017-03-28 MED ORDER — DULOXETINE HCL 40 MG PO CPEP: 40.0000 mg | ORAL_CAPSULE | Freq: Every day | ORAL | 5 refills | Status: DC

## 2017-03-28 NOTE — Patient Instructions (Signed)
Fever reducer and headache: tylenol and ibuprofen, can take together or alternating   Sinus pressure:  Nasal steroid such as flonase/fluticaone or nasocort daily Can also take daily antihistamine such as loratadine/claritin or cetirizine/zyrtec  Sinus rinses/irritation: Netipot or similar with distilled water 2-3 times a day to clear out sinuses or Normal saline nasal spray  Sore throat:  Throat lozenges chloroseptic spray  Stick with bland foods Drink lots of fluids  

## 2017-03-28 NOTE — Progress Notes (Signed)
  Subjective:   Patient ID: Virginia Black, female    DOB: [birth year], 52 y.o.   MRN: [id number] CC: Sore Throat; Nasal Congestion; and Facial Pain  HPI: Virginia Black is a 52 y.o. female presenting for Sore Throat; Nasal Congestion; and Facial Pain  Started getting sick with sore throat about 48 hrs ago. Congestion, fatigue, subjective fevers.  Depression: taking cymbalta 40mg  once a day in the morning. Not snapping as much, mood has been more even. Also taking wellbutrin once a day. In counseling with husband. No thoughts of not wanting to be here.  Relevant past medical, surgical, family and social history reviewed. Allergies and medications reviewed and updated. Social History   Tobacco Use  Smoking Status Never Smoker  Smokeless Tobacco Never Used   ROS: Per HPI   Objective:    BP 139/71   Pulse 76   Temp 99 F (37.2 C) (Oral)   Ht 5\' 8"  (1.727 m)   Wt 198 lb 9.6 oz (90.1 kg)   BMI 30.20 kg/m   Wt Readings from Last 3 Encounters:  03/28/17 198 lb 9.6 oz (90.1 kg)  01/28/17 200 lb 9.6 oz (91 kg)  06/23/16 202 lb 9.6 oz (91.9 kg)    Gen: NAD, alert, cooperative with exam, NCAT, congested EYES: EOMI, no conjunctival injection, or no icterus ENT:  TMs dull gray b/l, OP without erythema LYMPH: no cervical LAD CV: NRRR, normal S1/S2, no murmur, distal pulses 2+ b/l Resp: CTABL, no wheezes, normal WOB Ext: No edema, warm Neuro: Alert and oriented, strength equal b/l UE and LE, coordination grossly normal MSK: normal muscle bulk  Assessment & Plan:  Bindi was seen today for sore throat, nasal congestion and facial pain.  Diagnoses and all orders for this visit:  Sore throat Flu+ -     Veritor Flu A/B Waived -     Rapid Strep Screen (Not at [facility]) -     Culture, Group A Strep  Fever, unspecified fever cause -     Veritor Flu A/B Waived -     Rapid Strep Screen (Not at [facility]) -     Culture, Group A Strep  Body aches -     Veritor Flu A/B Waived -     Rapid Strep  Screen (Not at [facility]) -     Culture, Group A Strep  Depression, recurrent (HCC) Stable, cont below. Cont counseling -     DULoxetine 40 MG CPEP; Take 40 mg by mouth daily.  Anxiety Stable, cont below -     buPROPion (WELLBUTRIN XL) 150 MG 24 hr tablet; Take 1 tablet (150 mg total) by mouth daily.  Influenza A Discussed max benefit from tamiflu comes if started first 48h of illness. Pt wants to start med. Start below. Symptom care, return precautions discussed. -     oseltamivir (TAMIFLU) 75 MG capsule; Take 1 capsule (75 mg total) by mouth 2 (two) times daily.  Other orders -     Culture, Group A Strep   Follow up plan: Return in about 6 months (around 09/25/2017). Assunta Found, MD [place]

## 2017-04-10 ENCOUNTER — Encounter: Payer: BLUE CROSS/BLUE SHIELD | Admitting: Gastroenterology

## 2017-04-10 ENCOUNTER — Other Ambulatory Visit: Payer: Self-pay

## 2017-04-10 ENCOUNTER — Ambulatory Visit (AMBULATORY_SURGERY_CENTER): Payer: Self-pay | Admitting: *Deleted

## 2017-04-10 VITALS — Ht 68.0 in | Wt 200.0 lb

## 2017-04-10 DIAGNOSIS — Z1211 Encounter for screening for malignant neoplasm of colon: Secondary | ICD-10-CM

## 2017-04-10 MED ORDER — NA SULFATE-K SULFATE-MG SULF 17.5-3.13-1.6 GM/177ML PO SOLN: ORAL | 0 refills | Status: DC

## 2017-04-10 NOTE — Progress Notes (Signed)
Patient denies any allergies to eggs or soy. Patient denies any problems with anesthesia/sedation. Patient denies any oxygen use at home. Patient denies taking any diet/weight loss medications or blood thinners. EMMI education assisgned to patient on colonoscopy, this was explained and instructions given to patient. Suprep coupon printed and given to the pt during PV.

## 2017-04-18 ENCOUNTER — Encounter: Payer: Self-pay | Admitting: Gastroenterology

## 2017-04-19 ENCOUNTER — Telehealth: Payer: Self-pay | Admitting: *Deleted

## 2017-04-24 ENCOUNTER — Encounter: Payer: Self-pay | Admitting: Gastroenterology

## 2017-04-24 ENCOUNTER — Ambulatory Visit (AMBULATORY_SURGERY_CENTER): Payer: BLUE CROSS/BLUE SHIELD | Admitting: Gastroenterology

## 2017-04-24 VITALS — BP 125/75 | HR 70 | Temp 97.1°F | Resp 8 | Ht 68.0 in | Wt 198.0 lb

## 2017-04-24 DIAGNOSIS — Z1211 Encounter for screening for malignant neoplasm of colon: Secondary | ICD-10-CM | POA: Diagnosis not present

## 2017-04-24 DIAGNOSIS — K635 Polyp of colon: Secondary | ICD-10-CM | POA: Diagnosis not present

## 2017-04-24 DIAGNOSIS — D125 Benign neoplasm of sigmoid colon: Secondary | ICD-10-CM

## 2017-04-24 MED ORDER — SODIUM CHLORIDE 0.9 % IV SOLN: 500.0000 mL | Freq: Once | INTRAVENOUS | Status: DC

## 2017-04-24 NOTE — Progress Notes (Signed)
Report to PACU, RN, vss, BBS= Clear.  

## 2017-04-24 NOTE — Op Note (Signed)
[place] Patient Name: Virginia Black Procedure Date: 04/24/2017 1:47 PM MRN: [id number] Endoscopist: Mauri Pole , MD Age: 52 Referring MD:  Date of Birth: 01/01/1966 Gender: Female Account #: [id number] Procedure:                Colonoscopy Indications:              Screening for colorectal malignant neoplasm, This                            is the patient's first colonoscopy Medicines:                Monitored Anesthesia Care Procedure:                Pre-Anesthesia Assessment:                           - Prior to the procedure, a History and Physical                            was performed, and patient medications and                            allergies were reviewed. The patient's tolerance of                            previous anesthesia was also reviewed. The risks                            and benefits of the procedure and the sedation                            options and risks were discussed with the patient.                            All questions were answered, and informed consent                            was obtained. Prior Anticoagulants: The patient has                            taken no previous anticoagulant or antiplatelet                            agents. ASA Grade Assessment: II - A patient with                            mild systemic disease. After reviewing the risks                            and benefits, the patient was deemed in                            satisfactory condition to undergo the procedure.  After obtaining informed consent, the colonoscope                            was passed under direct vision. Throughout the                            procedure, the patient's blood pressure, pulse, and                            oxygen saturations were monitored continuously. The                            Colonoscope was introduced through the anus and                            advanced to the the cecum,  identified by                            appendiceal orifice and ileocecal valve. The                            colonoscopy was performed without difficulty. The                            patient tolerated the procedure well. The quality                            of the bowel preparation was excellent. The                            ileocecal valve, appendiceal orifice, and rectum                            were photographed. Scope In: 1:52:03 PM Scope Out: 2:10:00 PM Scope Withdrawal Time: 0 hours 12 minutes 32 seconds  Total Procedure Duration: 0 hours 17 minutes 57 seconds  Findings:                 The perianal and digital rectal examinations were                            normal.                           A 7 mm polyp was found in the sigmoid colon. The                            polyp was sessile. The polyp was removed with a                            cold snare. Resection and retrieval were complete.                           Scattered small and large-mouthed diverticula were  found in the sigmoid colon, descending colon,                            transverse colon and ascending colon.                           Non-bleeding internal hemorrhoids were found during                            retroflexion. The hemorrhoids were small. Complications:            No immediate complications. Estimated Blood Loss:     Estimated blood loss: none. Impression:               - One 7 mm polyp in the sigmoid colon, removed with                            a cold snare. Resected and retrieved.                           - Mild diverticulosis in the sigmoid colon, in the                            descending colon, in the transverse colon and in                            the ascending colon.                           - Non-bleeding internal hemorrhoids. Recommendation:           - Patient has a contact number available for                            emergencies. The  signs and symptoms of potential                            delayed complications were discussed with the                            patient. Return to normal activities tomorrow.                            Written discharge instructions were provided to the                            patient.                           - Resume previous diet.                           - Continue present medications.                           - Await pathology results.                           -  Repeat colonoscopy in 5-10 years for surveillance                            based on pathology results. Mauri Pole, MD 04/24/2017 2:16:28 PM This report has been signed electronically.

## 2017-04-24 NOTE — Progress Notes (Signed)
Called to room to assist during endoscopic procedure.  Patient ID and intended procedure confirmed with present staff. Received instructions for my participation in the procedure from the performing physician.  

## 2017-04-24 NOTE — Patient Instructions (Signed)
POLYP INFORMATION GIVEN.    YOU HAD AN ENDOSCOPIC PROCEDURE TODAY AT THE [facility]:   Refer to the procedure report that was given to you for any specific questions about what was found during the examination.  If the procedure report does not answer your questions, please call your gastroenterologist to clarify.  If you requested that your care partner not be given the details of your procedure findings, then the procedure report has been included in a sealed envelope for you to review at your convenience later.  YOU SHOULD EXPECT: Some feelings of bloating in the abdomen. Passage of more gas than usual.  Walking can help get rid of the air that was put into your GI tract during the procedure and reduce the bloating. If you had a lower endoscopy (such as a colonoscopy or flexible sigmoidoscopy) you may notice spotting of blood in your stool or on the toilet paper. If you underwent a bowel prep for your procedure, you may not have a normal bowel movement for a few days.  Please Note:  You might notice some irritation and congestion in your nose or some drainage.  This is from the oxygen used during your procedure.  There is no need for concern and it should clear up in a day or so.  SYMPTOMS TO REPORT IMMEDIATELY:   Following lower endoscopy (colonoscopy or flexible sigmoidoscopy):  Excessive amounts of blood in the stool  Significant tenderness or worsening of abdominal pains  Swelling of the abdomen that is new, acute  Fever of 100F or higher   For urgent or emergent issues, a gastroenterologist can be reached at any hour by calling [phone].   DIET:  We do recommend a small meal at first, but then you may proceed to your regular diet.  Drink plenty of fluids but you should avoid alcoholic beverages for 24 hours.  ACTIVITY:  You should plan to take it easy for the rest of today and you should NOT DRIVE or use heavy machinery until tomorrow (because of the sedation  medicines used during the test).    FOLLOW UP: Our staff will call the number listed on your records the next business day following your procedure to check on you and address any questions or concerns that you may have regarding the information given to you following your procedure. If we do not reach you, we will leave a message.  However, if you are feeling well and you are not experiencing any problems, there is no need to return our call.  We will assume that you have returned to your regular daily activities without incident.  If any biopsies were taken you will be contacted by phone or by letter within the next 1-3 weeks.  Please call us at [phone] if you have not heard about the biopsies in 3 weeks.    SIGNATURES/CONFIDENTIALITY: You and/or your care partner have signed paperwork which will be entered into your electronic medical record.  These signatures attest to the fact that that the information above on your After Visit Summary has been reviewed and is understood.  Full responsibility of the confidentiality of this discharge information lies with you and/or your care-partner. 

## 2017-04-24 NOTE — Progress Notes (Signed)
Pt's states no medical or surgical changes since previsit or office visit. 

## 2017-04-25 ENCOUNTER — Telehealth: Payer: Self-pay

## 2017-04-25 NOTE — Telephone Encounter (Signed)
  Follow up Call-  Call back number 04/24/2017  Post procedure Call Back phone  # [phone]  Permission to leave phone message Yes  Some recent data might be hidden     Patient questions:  Do you have a fever, pain , or abdominal swelling? No. Pain Score  0 *  Have you tolerated food without any problems? Yes.    Have you been able to return to your normal activities? Yes.    Do you have any questions about your discharge instructions: Diet   No. Medications  No. Follow up visit  No.  Do you have questions or concerns about your Care? No.  Actions: * If pain score is 4 or above: No action needed, pain <4.

## 2017-05-05 ENCOUNTER — Encounter: Payer: Self-pay | Admitting: Gastroenterology

## 2017-05-07 ENCOUNTER — Ambulatory Visit (INDEPENDENT_AMBULATORY_CARE_PROVIDER_SITE_OTHER): Payer: BLUE CROSS/BLUE SHIELD | Admitting: Physician Assistant

## 2017-05-07 ENCOUNTER — Encounter: Payer: Self-pay | Admitting: Physician Assistant

## 2017-05-07 VITALS — BP 133/71 | HR 74 | Temp 98.1°F | Ht 68.0 in | Wt 201.0 lb

## 2017-05-07 DIAGNOSIS — M79671 Pain in right foot: Secondary | ICD-10-CM | POA: Diagnosis not present

## 2017-05-07 DIAGNOSIS — G8929 Other chronic pain: Secondary | ICD-10-CM

## 2017-05-07 MED ORDER — METHYLPREDNISOLONE ACETATE 80 MG/ML IJ SUSP: 80.0000 mg | Freq: Once | INTRAMUSCULAR | Status: DC

## 2017-05-07 MED ORDER — MELOXICAM 15 MG PO TABS: 15.0000 mg | ORAL_TABLET | Freq: Every day | ORAL | 5 refills | Status: DC

## 2017-05-07 NOTE — Patient Instructions (Signed)
Foot  Chronic Ankle Instability Rehab After Surgery Ask your health care provider which exercises are safe for you. Do exercises exactly as told by your health care provider and adjust them as directed. It is normal to feel mild stretching, pulling, tightness, or discomfort as you do these exercises, but you should stop right away if you feel sudden pain or your pain gets worse.Do not begin these exercises until told by your health care provider. Stretching and range of motion exercises These exercises warm up your muscles and joints and improve the movement and flexibility of your ankle. These exercises also help to relieve pain, numbness, and tingling. Exercise A: Dorsiflexion/plantar flexion  1. Sit with your left / right knee straight or bent. Do not rest your foot on anything. 2. Flex your left / right ankle to tilt the top of your foot toward your shin. 3. Hold this position for __________ seconds. 4. Point your toes downward to tilt the top of your foot away from your shin. 5. Hold this position for __________ seconds. Repeat __________ times with your knee straight, then __________ times with your knee bent. Complete this exercise __________ times a day. Exercise B: Ankle alphabet  1. Sit with your left / right leg supported at the lower leg. ? Do not rest your foot on anything. ? Make sure your foot has room to move freely. 2. Think of your left / right foot as a paintbrush, and move your foot to trace each letter of the alphabet in the air. Keep your hip and knee still while you trace. Make the letters as large as you can without feeling discomfort. 3. Trace every letter from A to Z. Repeat __________ times. Complete this exercise __________ times a day. Exercise C: Gastrocsoleus  1. Sit on the floor with your left / right leg extended. 2. Loop a belt or towel around the ball of your left / right foot. The ball of your foot is on the walking surface, right under your toes. 3. Keep  your left / right ankle and foot relaxed and keep your knee straight while you use the belt or towel to pull your foot and ankle toward you. You should feel a gentle stretch behind your calf or knee. 4. Hold this position for __________ seconds. Repeat __________ times. Complete this exercise __________ times a day. Exercise D: Ankle dorsiflexion, active-assisted  1. Sit on a chair that is placed on a non-carpeted surface. 2. Place your left / right foot on the floor, directly under your knee. Extend your other leg for support. 3. Keeping your heel down, slide your left / right foot back toward the chair until you feel a stretch at your ankle or calf. If you do not feel a stretch, slide your buttocks forward to the edge of the chair. 4. Hold this stretch for __________ seconds. Repeat __________ times. Complete this stretch __________ times a day. Strengthening exercises These exercises build strength and endurance in your ankle. Endurance is the ability to use your muscles for a long time, even after they get tired. Exercise E: Dorsiflexion with band  1. Secure a rubber exercise band or tube to an object, like a table leg, that will not move if it is pulled on. Secure the other end around your left / right foot. 2. Sit on the floor facing the object with your left / right foot extended. The band or tube should be slightly tense when your foot is relaxed. 3. Slowly flex  your left / right ankle and toes to bring your foot toward you. 4. Hold this position for __________ seconds. 5. Let the band or tube slowly pull your foot back to the starting position. Repeat __________ times. Complete this exercise times a day. Exercise F: Plantar flexion with band  1. Sit on the floor with your left / right leg extended. 2. Loop a rubber exercise band or tube around the ball of your left / right foot. The ball of your foot is on the walking surface, right under your toes. The band or tube should be slightly  tense when your foot is relaxed. 3. Slowly point your toes downward, pushing them away from you. 4. Hold this position for __________ seconds. 5. Let the band or tube slowly pull your foot back to the starting position. Repeat __________ times. Complete this exercise __________ times a day. Exercise G: Ankle eversion with band  1. Secure one end of an exercise band or tubing to a fixed object, such as a table leg or a pole, that will stay still when the band is pulled. 2. Loop the other end of the band around the middle of your left / right foot. 3. Sit on the floor, facing the fixed object. The band should be slightly tense when your foot is relaxed. 4. Make fists with your hands and put them between your knees. This will focus your strengthening at your ankle. 5. Leading with your little toe, slowly push your banded foot outward, away from your body. The band or tube should be adding resistance. 6. Hold this position for __________ seconds. 7. Slowly return your foot to the starting position while controlling the tension in the band. Repeat __________ times. Complete this exercise __________ times a day. Exercise H: Ankle inversion with band  1. Secure one end of an exercise band or tubing to a fixed object, such as a table leg or a pole, that will stay still when the band is pulled. 2. Loop the other end of the band around your left / right foot, near your toes. 3. Sit on the floor, facing the fixed object. The band should be slightly tense when your foot is relaxed. 4. Make fists with your hands and put them between your knees. This will focus your strengthening at your ankle. 5. Leading with your big toe, slowly pull your banded foot inward, toward your body. The band or tube should be adding resistance. 6. Hold this position for __________ seconds. 7. Let the band or tube slowly pull your foot back to the starting position. Repeat __________ times. Complete this exercises __________  times a day. Exercise I: Towel curls  1. Sit in a chair on a non-carpeted surface, and put your feet on the floor. 2. Place a towel in front of your feet. If told by your health care provider, add __________ to the end of the towel. 3. Keeping your heel on the floor, put your left / right footon the towel. 4. Pull the towel toward you by grabbing the towel with your toes and curling them under. Keep your heel on the floor. Repeat __________ times. Complete this exercise __________ times a day. This information is not intended to replace advice given to you by your health care provider. Make sure you discuss any questions you have with your health care provider. Document Released: 05/23/2015 Document Revised: 10/06/2015 Document Reviewed: 02/12/2015 Elsevier Interactive Patient Education  [employer].

## 2017-05-08 NOTE — Progress Notes (Signed)
BP 133/71   Pulse 74   Temp 98.1 F (36.7 C) (Oral)   Ht 5\' 8"  (1.727 m)   Wt 201 lb (91.2 kg)   BMI 30.56 kg/m    Subjective:    Patient ID: Virginia Black, female    DOB: [birth year], 52 y.o.   MRN: [id number]  HPI: Virginia Black is a 52 y.o. female presenting on 05/07/2017 for Foot Swelling (right foot swelling for last 3 weeks, puts ice on it in evenings this goes down then when she gets up in the morning after a little while it starts again, does have an anti inflammatory med, pain mostly on outer aspect, no known injury)  Patient comes in with right foot edema is been going on for about 3 weeks.  She states it is in her right lateral foot.  But it will fall into her ankle.  It is not associated redness or skin changes.  She has had a history of plantar fasciitis in that foot last year.  She had had a broken left foot in the previous time before that and was causing her to use her right foot incorrectly and therefore developed these problems.  While she was taking the anti-inflammatory this right foot new problem has not been as bad.  Since she has stopped it her foot has had much more pain.  Very bad by the end of the day.  It hurts when she gets up in the morning.  Past Medical History:  Diagnosis Date  . Allergy   . Anxiety   . Sleep apnea    uses CPAP    Relevant past medical, surgical, family and social history reviewed and updated as indicated. Interim medical history since our last visit reviewed. Allergies and medications reviewed and updated. DATA REVIEWED: CHART IN EPIC  Family History reviewed for pertinent findings.  Review of Systems  Constitutional: Negative.   HENT: Negative.   Eyes: Negative.   Respiratory: Negative.   Gastrointestinal: Negative.   Genitourinary: Negative.   Musculoskeletal: Positive for arthralgias, gait problem and joint swelling.    Allergies as of 05/07/2017      Reactions   Erythromycin Nausea And Vomiting      Medication List       Accurate as of 05/07/17 11:59 PM. Always use your most recent med list.          buPROPion 150 MG 24 hr tablet Commonly known as:  WELLBUTRIN XL Take 1 tablet (150 mg total) by mouth daily.   DULoxetine HCl 40 MG Cpep Take 40 mg by mouth daily.   Fish Oil 1000 MG Caps Take by mouth.   loratadine 10 MG tablet Commonly known as:  CLARITIN Take by mouth.   meloxicam 15 MG tablet Commonly known as:  MOBIC Take 1 tablet (15 mg total) by mouth daily.   THERA Tabs Take by mouth.   Turmeric 500 MG Caps Take 1 capsule by mouth daily.          Objective:    BP 133/71   Pulse 74   Temp 98.1 F (36.7 C) (Oral)   Ht 5\' 8"  (1.727 m)   Wt 201 lb (91.2 kg)   BMI 30.56 kg/m   Allergies  Allergen Reactions  . Erythromycin Nausea And Vomiting    Wt Readings from Last 3 Encounters:  05/07/17 201 lb (91.2 kg)  04/24/17 198 lb (89.8 kg)  04/10/17 200 lb (90.7 kg)    Physical Exam  Constitutional: She is oriented to person, place, and time. She appears well-developed and well-nourished.  HENT:  Head: Normocephalic and atraumatic.  Eyes: Pupils are equal, round, and reactive to light. Conjunctivae and EOM are normal.  Cardiovascular: Normal rate, regular rhythm, normal heart sounds and intact distal pulses.  Pulmonary/Chest: Effort normal and breath sounds normal.  Abdominal: Soft. Bowel sounds are normal.  Musculoskeletal:       Right foot: There is tenderness and swelling. There is no crepitus and no deformity.       Feet:  Neurological: She is alert and oriented to person, place, and time. She has normal reflexes.  Skin: Skin is warm and dry. No rash noted.  Psychiatric: She has a normal mood and affect. Her behavior is normal. Judgment and thought content normal.        Assessment & Plan:   1. Chronic pain in right foot - meloxicam (MOBIC) 15 MG tablet; Take 1 tablet (15 mg total) by mouth daily.  Dispense: 30 tablet; Refill: 5 Physical therapy exercises for  the foot and ankle are given to the patient  Continue all other maintenance medications as listed above.  Follow up plan: No follow-ups on file.  Educational handout given for [place] PA-C Joseph [address]  [place], [place] [address] [phone]   05/08/2017, 1:52 PM

## 2017-08-13 ENCOUNTER — Telehealth: Payer: Self-pay | Admitting: Pediatrics

## 2017-08-13 NOTE — Telephone Encounter (Signed)
Spoke with pt appt scheduled for evaluation

## 2017-08-17 ENCOUNTER — Ambulatory Visit: Payer: BLUE CROSS/BLUE SHIELD | Admitting: Family

## 2017-08-21 ENCOUNTER — Ambulatory Visit: Payer: BLUE CROSS/BLUE SHIELD | Admitting: Pediatrics

## 2017-10-13 ENCOUNTER — Other Ambulatory Visit: Payer: Self-pay | Admitting: Pediatrics

## 2017-10-13 DIAGNOSIS — F339 Major depressive disorder, recurrent, unspecified: Secondary | ICD-10-CM

## 2017-11-27 ENCOUNTER — Other Ambulatory Visit: Payer: Self-pay | Admitting: Pediatrics

## 2017-11-27 DIAGNOSIS — F339 Major depressive disorder, recurrent, unspecified: Secondary | ICD-10-CM

## 2017-11-28 NOTE — Telephone Encounter (Signed)
Vincent. NTBS. 30 days given 10/16/17

## 2017-11-28 NOTE — Telephone Encounter (Signed)
Please call to schedule an appointment for refills to be done.

## 2017-12-10 ENCOUNTER — Telehealth: Payer: Self-pay | Admitting: *Deleted

## 2017-12-10 NOTE — Telephone Encounter (Signed)
Chart entered in error. SM 

## 2017-12-10 NOTE — Telephone Encounter (Signed)
Chart entered error. Sm

## 2017-12-12 ENCOUNTER — Encounter: Payer: Self-pay | Admitting: Pediatrics

## 2017-12-12 ENCOUNTER — Ambulatory Visit (INDEPENDENT_AMBULATORY_CARE_PROVIDER_SITE_OTHER): Payer: BLUE CROSS/BLUE SHIELD | Admitting: Pediatrics

## 2017-12-12 VITALS — BP 128/72 | HR 70 | Temp 97.7°F | Ht 68.0 in | Wt 210.0 lb

## 2017-12-12 DIAGNOSIS — F419 Anxiety disorder, unspecified: Secondary | ICD-10-CM | POA: Diagnosis not present

## 2017-12-12 DIAGNOSIS — Z6831 Body mass index (BMI) 31.0-31.9, adult: Secondary | ICD-10-CM

## 2017-12-12 DIAGNOSIS — Z23 Encounter for immunization: Secondary | ICD-10-CM | POA: Diagnosis not present

## 2017-12-12 DIAGNOSIS — F339 Major depressive disorder, recurrent, unspecified: Secondary | ICD-10-CM

## 2017-12-12 MED ORDER — BUPROPION HCL ER (XL) 150 MG PO TB24: 150.0000 mg | ORAL_TABLET | Freq: Every day | ORAL | 4 refills | Status: DC

## 2017-12-12 MED ORDER — DULOXETINE HCL 60 MG PO CPEP: 60.0000 mg | ORAL_CAPSULE | Freq: Every day | ORAL | 4 refills | Status: DC

## 2017-12-12 NOTE — Progress Notes (Signed)
  Subjective:   Patient ID: Virginia Black, female    DOB: July 31, 1965, 53 y.o.   MRN: [id number] CC: Medication Refill  HPI: Virginia Black is a 52 y.o. female   Anxiety and depression: Anxiety bothering her the most.  Taking duloxetine and Wellbutrin regularly.  Has had some ongoing symptoms recently mostly with anxiety.  Feels safe at home, no thoughts of self-harm.  BMI elevated: Weight recently up.  Would like to start bringing down.  Very open to lifestyle changes.  Relevant past medical, surgical, family and social history reviewed. Allergies and medications reviewed and updated. Social History   Tobacco Use  Smoking Status Former Smoker  . Types: Cigarettes  . Last attempt to quit: 02/13/2000  . Years since quitting: 17.8  Smokeless Tobacco Never Used   ROS: Per HPI   Objective:    BP 128/72   Pulse 70   Temp 97.7 F (36.5 C) (Oral)   Ht 5\' 8"  (1.727 m)   Wt 210 lb (95.3 kg)   BMI 31.93 kg/m   Wt Readings from Last 3 Encounters:  12/12/17 210 lb (95.3 kg)  05/07/17 201 lb (91.2 kg)  04/24/17 198 lb (89.8 kg)    Gen: NAD, alert, cooperative with exam, NCAT EYES: EOMI, no conjunctival injection, or no icterus ENT:  TMs pearly gray b/l, OP without erythema LYMPH: no cervical LAD CV: NRRR, normal S1/S2, no murmur, distal pulses 2+ b/l Resp: CTABL, no wheezes, normal WOB Abd: +BS, soft, NTND.  Ext: No edema, warm Neuro: Alert and oriented, strength equal b/l UE and LE, coordination grossly normal MSK: normal muscle bulk  Assessment & Plan:  Virginia Black was seen today for medication refill.  Diagnoses and all orders for this visit:  Anxiety Some ongoing symptoms, will increase to 60 mg. -     DULoxetine (CYMBALTA) 60 MG capsule; Take 1 capsule (60 mg total) by mouth daily.  Depression, recurrent ([place]) Stable, continue below -     buPROPion (WELLBUTRIN XL) 150 MG 24 hr tablet; Take 1 tablet (150 mg total) by mouth daily.  BMI 31.0-31.9,adult Lifestyle changes  discussed.  Goal approximately 5 pound weight loss over the next few months.  Has well exam scheduled in December. Need for immunization against influenza -     Flu Vaccine QUAD 36+ mos IM   Follow up plan: Return in about 2 months (around 02/11/2018). Virginia Found, MD [place]

## 2018-03-27 ENCOUNTER — Ambulatory Visit (INDEPENDENT_AMBULATORY_CARE_PROVIDER_SITE_OTHER): Payer: BLUE CROSS/BLUE SHIELD | Admitting: Family Medicine

## 2018-03-27 ENCOUNTER — Encounter: Payer: Self-pay | Admitting: Family Medicine

## 2018-03-27 VITALS — BP 120/67 | HR 95 | Temp 98.9°F | Ht 68.0 in | Wt 206.2 lb

## 2018-03-27 DIAGNOSIS — J020 Streptococcal pharyngitis: Secondary | ICD-10-CM

## 2018-03-27 DIAGNOSIS — J029 Acute pharyngitis, unspecified: Secondary | ICD-10-CM

## 2018-03-27 LAB — RAPID STREP SCREEN (MED CTR MEBANE ONLY): Strep Gp A Ag, IA W/Reflex: POSITIVE — AB

## 2018-03-27 MED ORDER — PENICILLIN V POTASSIUM 125 MG/5ML PO SOLR: 500.0000 mg | Freq: Two times a day (BID) | ORAL | 0 refills | Status: AC

## 2018-03-27 NOTE — Patient Instructions (Signed)

## 2018-03-27 NOTE — Progress Notes (Signed)
    Virginia Black is a 53 y.o. female presenting with a sore throat for 2 days.  Associated symptoms include:  fever, chills and nausea.  Symptoms are progressively worsening.  Home treatment thus far includes:  NSAIDS/acetaminophen. Minimal relief of symptoms.   Known sick contacts with similar symptoms.  There is a previous history of of similar symptoms.  Review of Systems  Constitutional: Positive for chills, fever and malaise/fatigue.  HENT: Positive for sore throat.   Respiratory: Negative for cough, sputum production and shortness of breath.   Cardiovascular: Negative for chest pain.  Gastrointestinal: Positive for nausea. Negative for abdominal pain.  Neurological: Negative for weakness and headaches.  All other systems reviewed and are negative.   Exam:  BP 120/67   Pulse 95   Temp 98.9 F (37.2 C) (Oral)   Ht 5\' 8"  (1.727 m)   Wt 206 lb 3.2 oz (93.5 kg)   BMI 31.35 kg/m  Physical Exam  Constitutional: She is oriented to person, place, and time. She appears not dehydrated.  Non-toxic appearance. She does not have a sickly appearance. She appears distressed (mild).  HENT:  Head: Normocephalic and atraumatic.  Right Ear: Hearing, tympanic membrane, external ear and ear canal normal.  Left Ear: Hearing, tympanic membrane, external ear and ear canal normal.  Nose: Nose normal. Right sinus exhibits no maxillary sinus tenderness and no frontal sinus tenderness. Left sinus exhibits no maxillary sinus tenderness and no frontal sinus tenderness.  Mouth/Throat: Uvula is midline and mucous membranes are normal. Oropharyngeal exudate, posterior oropharyngeal edema and posterior oropharyngeal erythema present. No tonsillar abscesses.  Eyes: Pupils are equal, round, and reactive to light. Conjunctivae and lids are normal.  Neck: Trachea normal and normal range of motion. Neck supple.  Cardiovascular: Normal rate, regular rhythm and normal heart sounds. Exam reveals no gallop and no  friction rub.  No murmur heard. Pulmonary/Chest: Effort normal and breath sounds normal. No respiratory distress.  Abdominal: Soft. Bowel sounds are normal.  Lymphadenopathy:    She has cervical adenopathy (moderate).  Neurological: She is alert and oriented to person, place, and time.  Skin: Skin is warm and dry. No rash noted. No erythema. No pallor.  Psychiatric: Mood, memory, affect and judgment normal.   Rapid strep positive today.   Assessment and Plan  Virginia Black was seen today for sore throat.  Diagnoses and all orders for this visit:  Strep pharyngitis Symptomatic care discussed. Liquid medication preferred. Medications as prescribed. Report any new or worsening symptoms. -     penicillin potassium (VEETID) 125 MG/5ML solution; Take 20 mLs (500 mg total) by mouth 2 (two) times daily for 10 days.  Sore throat -     Rapid Strep Screen ([facility] ONLY)   Return if symptoms worsen or fail to improve.  The above assessment and management plan was discussed with the patient. The patient verbalized understanding of and has agreed to the management plan. Patient is aware to call the clinic if symptoms fail to improve or worsen. Patient is aware when to return to the clinic for a follow-up visit. Patient educated on when it is appropriate to go to the emergency department.   Monia Pouch, FNP-C [facility] [address] [place], [place] [address] [phone]

## 2018-03-28 ENCOUNTER — Telehealth: Payer: Self-pay | Admitting: Family Medicine

## 2018-03-28 NOTE — Telephone Encounter (Signed)
Patient aware.

## 2018-03-28 NOTE — Telephone Encounter (Signed)
Patient calling to make you aware that when she went to pharmacy to pick up medication last night they did not have the exact penicillin as you prescribed but substituted with Pencillin  VK 250/5 ml, take 10 ml bid x 10 days.  I explained to patient this was the same dosage as prescribed by you but she wanted you to be aware.  She said the Cepacol is helping also.  She did not get the Tylenol Sore Throat but is taking Robitussin CF also.

## 2018-03-28 NOTE — Telephone Encounter (Signed)
Aware. 

## 2018-10-31 DIAGNOSIS — G4733 Obstructive sleep apnea (adult) (pediatric): Secondary | ICD-10-CM | POA: Diagnosis not present

## 2019-01-28 ENCOUNTER — Other Ambulatory Visit: Payer: Self-pay

## 2019-01-29 ENCOUNTER — Ambulatory Visit (INDEPENDENT_AMBULATORY_CARE_PROVIDER_SITE_OTHER): Payer: BC Managed Care – PPO | Admitting: Family Medicine

## 2019-01-29 ENCOUNTER — Encounter: Payer: Self-pay | Admitting: Family Medicine

## 2019-01-29 VITALS — BP 123/74 | HR 81 | Temp 97.7°F | Ht 68.0 in | Wt 207.2 lb

## 2019-01-29 DIAGNOSIS — H6121 Impacted cerumen, right ear: Secondary | ICD-10-CM

## 2019-01-29 DIAGNOSIS — J302 Other seasonal allergic rhinitis: Secondary | ICD-10-CM

## 2019-01-29 DIAGNOSIS — Z0000 Encounter for general adult medical examination without abnormal findings: Secondary | ICD-10-CM | POA: Diagnosis not present

## 2019-01-29 DIAGNOSIS — F419 Anxiety disorder, unspecified: Secondary | ICD-10-CM

## 2019-01-29 DIAGNOSIS — G4733 Obstructive sleep apnea (adult) (pediatric): Secondary | ICD-10-CM

## 2019-01-29 DIAGNOSIS — Z0001 Encounter for general adult medical examination with abnormal findings: Secondary | ICD-10-CM

## 2019-01-29 DIAGNOSIS — G4730 Sleep apnea, unspecified: Secondary | ICD-10-CM | POA: Insufficient documentation

## 2019-01-29 DIAGNOSIS — Z136 Encounter for screening for cardiovascular disorders: Secondary | ICD-10-CM | POA: Diagnosis not present

## 2019-01-29 DIAGNOSIS — Z23 Encounter for immunization: Secondary | ICD-10-CM | POA: Diagnosis not present

## 2019-01-29 MED ORDER — SHINGRIX 50 MCG/0.5ML IM SUSR: 0.5000 mL | Freq: Once | INTRAMUSCULAR | 0 refills | Status: AC

## 2019-01-29 MED ORDER — BUPROPION HCL ER (XL) 150 MG PO TB24: 150.0000 mg | ORAL_TABLET | Freq: Every day | ORAL | 3 refills | Status: DC

## 2019-01-29 MED ORDER — DULOXETINE HCL 60 MG PO CPEP: 60.0000 mg | ORAL_CAPSULE | Freq: Every day | ORAL | 3 refills | Status: DC

## 2019-01-29 NOTE — Progress Notes (Signed)
 Assessment & Plan:  1. Well adult exam - Preventive care education provided.  Patient is up-to-date with colonoscopy and influenza vaccine.  Tdap was given today.  Shingrix sent to pharmacy for administration.  Patient declined HIV screening.  She is due for her mammogram as her last one was in 2016 but reports she is unable to schedule at this time.  They are working short and she will not be able to schedule until someone else is hired.  She will do this with the breast imaging center in [place].  She is also due for her pap smear and has these done with Dr. Taavon at [facility]; she will schedule again as soon as they hire someone at work. - CBC with Differential/Platelet - Lipid Panel - CMP14+EGFR - TSH  2. Anxiety - Well controlled on current regimen.  - DULoxetine (CYMBALTA) 60 MG capsule; Take 1 capsule (60 mg total) by mouth daily.  Dispense: 90 capsule; Refill: 3 - buPROPion (WELLBUTRIN XL) 150 MG 24 hr tablet; Take 1 tablet (150 mg total) by mouth daily.  Dispense: 90 tablet; Refill: 3  3. Obstructive sleep apnea syndrome - Patient does wear CPAP nightly.   4. Seasonal allergies - Well controlled on current regimen.   5. Excessive cerumen in right ear canal - Ear wax removal successful.   6. Immunization due - SHINGRIX injection; Inject 0.5 mLs into the muscle once for 1 dose.  Dispense: 0.5 mL; Refill: 0   Follow-up: Return in about 1 year (around 01/29/2020) for annual physical.   Britney Freundlich, MSN, APRN, FNP-C [facility]  Subjective:  Patient ID: Virginia Black, female    DOB: 07/10/1965  Age: 53 y.o. MRN: [id number]  Patient Care Team: Rakes, Linda M, FNP as PCP - General (Family Medicine)   CC:  Chief Complaint  Patient presents with  . Annual Exam    HPI Virginia Black presents for her annual physical.  Occupation: [employer], Marital status: married, Substance use: none Diet: no fried foods, portion control,  Exercise: none Last eye exam: 2 years ago Last dental exam: 3 years ago - appointment coming up Last colonoscopy: completed 2019 (normal) Last mammogram: last completed in 2016 - they are short at work so she is going to have to wait until they hire someone to complete Last pap smear: completed in 2016; she will schedule with OBGYN Refills needed today: yes Immunizations: Flu Vaccine: UTD  Tdap Vaccine: to be given today  Shingrix Vaccine: prescription sent to pharmacy    GAD 7 : Generalized Anxiety Score 01/29/2019  Nervous, Anxious, on Edge 1  Control/stop worrying 0  Worry too much - different things 1  Trouble relaxing 0  Restless 0  Easily annoyed or irritable 1  Afraid - awful might happen 0  Total GAD 7 Score 3  Anxiety Difficulty Not difficult at all   Depression screen PHQ 2/9 01/29/2019 03/27/2018 12/12/2017  Decreased Interest 0 0 0  Down, Depressed, Hopeless 0 0 0  PHQ - 2 Score 0 0 0  Altered sleeping 0 - -  Tired, decreased energy 0 - -  Change in appetite 0 - -  Feeling bad or failure about yourself  0 - -  Trouble concentrating 0 - -  Moving slowly or fidgety/restless 0 - -  Suicidal thoughts 0 - -  PHQ-9 Score 0 - -  Difficult doing work/chores Not difficult at all - -    Review of Systems    Constitutional: Negative for chills, fever, malaise/fatigue and weight loss.  HENT: Negative for congestion, ear discharge, ear pain, nosebleeds, sinus pain, sore throat and tinnitus.   Eyes: Negative for blurred vision, double vision, pain, discharge and redness.  Respiratory: Negative for cough, shortness of breath and wheezing.   Cardiovascular: Negative for chest pain, palpitations and leg swelling.  Gastrointestinal: Negative for abdominal pain, constipation, diarrhea, heartburn, nausea and vomiting.  Genitourinary: Negative for dysuria, frequency and urgency.  Musculoskeletal: Negative for myalgias.  Skin: Negative for rash.  Neurological: Negative for  dizziness, seizures, weakness and headaches.  Psychiatric/Behavioral: Negative for depression, substance abuse and suicidal ideas. The patient is not nervous/anxious.     Current Outpatient Medications:  .  buPROPion (WELLBUTRIN XL) 150 MG 24 hr tablet, Take 1 tablet (150 mg total) by mouth daily., Disp: 90 tablet, Rfl: 3 .  DULoxetine (CYMBALTA) 60 MG capsule, Take 1 capsule (60 mg total) by mouth daily., Disp: 90 capsule, Rfl: 3 .  loratadine (CLARITIN) 10 MG tablet, Take by mouth., Disp: , Rfl:  .  Multiple Vitamin (THERA) TABS, Take by mouth., Disp: , Rfl:  .  Omega-3 Fatty Acids (FISH OIL) 1000 MG CAPS, Take by mouth., Disp: , Rfl:  .  Turmeric 500 MG CAPS, Take 1 capsule by mouth daily., Disp: , Rfl:  .  SHINGRIX injection, Inject 0.5 mLs into the muscle once for 1 dose., Disp: 0.5 mL, Rfl: 0  Allergies  Allergen Reactions  . Erythromycin Nausea And Vomiting    Past Medical History:  Diagnosis Date  . Allergy   . Anxiety   . Sleep apnea    uses CPAP     Past Surgical History:  Procedure Laterality Date  . CYST EXCISION  2009   chest   . DILATION AND EVACUATION  2002    Family History  Problem Relation Age of Onset  . Hyperlipidemia Mother   . High blood pressure Mother   . Hyperlipidemia Father   . High blood pressure Father   . Diabetes Maternal Aunt   . Stroke Maternal Grandmother   . Emphysema Paternal Grandfather   . Colon cancer Neg Hx   . Esophageal cancer Neg Hx   . Stomach cancer Neg Hx     Social History   Socioeconomic History  . Marital status: Married    Spouse name: Not on file  . Number of children: Not on file  . Years of education: Not on file  . Highest education level: Not on file  Occupational History  . Not on file  Tobacco Use  . Smoking status: Former Smoker    Types: Cigarettes    Quit date: 02/13/2000    Years since quitting: 18.9  . Smokeless tobacco: Never Used  Substance and Sexual Activity  . Alcohol use: Yes     Alcohol/week: 7.0 standard drinks    Types: 7 Glasses of wine per week    Comment: red wine every day  per pt   . Drug use: No  . Sexual activity: Yes  Other Topics Concern  . Not on file  Social History Narrative  . Not on file   Social Determinants of Health   Financial Resource Strain:   . Difficulty of Paying Living Expenses: Not on file  Food Insecurity:   . Worried About Charity fundraiser in the Last Year: Not on file  . Ran Out of Food in the Last Year: Not on file  Transportation Needs:   .  Lack of Transportation (Medical): Not on file  . Lack of Transportation (Non-Medical): Not on file  Physical Activity:   . Days of Exercise per Week: Not on file  . Minutes of Exercise per Session: Not on file  Stress:   . Feeling of Stress : Not on file  Social Connections:   . Frequency of Communication with Friends and Family: Not on file  . Frequency of Social Gatherings with Friends and Family: Not on file  . Attends Religious Services: Not on file  . Active Member of Clubs or Organizations: Not on file  . Attends Club or Organization Meetings: Not on file  . Marital Status: Not on file  Intimate Partner Violence:   . Fear of Current or Ex-Partner: Not on file  . Emotionally Abused: Not on file  . Physically Abused: Not on file  . Sexually Abused: Not on file      Objective:    BP 123/74   Pulse 81   Temp 97.7 F (36.5 C) (Temporal)   Ht 5' 8" (1.727 m)   Wt 207 lb 3.2 oz (94 kg)   SpO2 97%   BMI 31.50 kg/m   Wt Readings from Last 3 Encounters:  01/29/19 207 lb 3.2 oz (94 kg)  03/27/18 206 lb 3.2 oz (93.5 kg)  12/12/17 210 lb (95.3 kg)    Physical Exam Vitals reviewed.  Constitutional:      General: She is not in acute distress.    Appearance: Normal appearance. She is obese. She is not ill-appearing, toxic-appearing or diaphoretic.  HENT:     Head: Normocephalic and atraumatic.     Right Ear: Tympanic membrane, ear canal and external ear normal.  There is no impacted cerumen.     Left Ear: Tympanic membrane, ear canal and external ear normal. There is no impacted cerumen.     Nose: Nose normal. No congestion or rhinorrhea.     Mouth/Throat:     Mouth: Mucous membranes are moist.     Pharynx: Oropharynx is clear. No oropharyngeal exudate or posterior oropharyngeal erythema.  Eyes:     General: No scleral icterus.       Right eye: No discharge.        Left eye: No discharge.     Conjunctiva/sclera: Conjunctivae normal.     Pupils: Pupils are equal, round, and reactive to light.  Cardiovascular:     Rate and Rhythm: Normal rate and regular rhythm.     Heart sounds: Normal heart sounds. No murmur. No friction rub. No gallop.   Pulmonary:     Effort: Pulmonary effort is normal. No respiratory distress.     Breath sounds: Normal breath sounds. No stridor. No wheezing, rhonchi or rales.  Abdominal:     General: Abdomen is flat. Bowel sounds are normal. There is no distension.     Palpations: Abdomen is soft. There is no mass.     Tenderness: There is no abdominal tenderness. There is no guarding or rebound.     Hernia: No hernia is present.  Musculoskeletal:        General: Normal range of motion.     Cervical back: Normal range of motion and neck supple. No rigidity. No muscular tenderness.  Lymphadenopathy:     Cervical: No cervical adenopathy.  Skin:    General: Skin is warm and dry.     Capillary Refill: Capillary refill takes less than 2 seconds.  Neurological:     General: No focal deficit   present.     Mental Status: She is alert and oriented to person, place, and time. Mental status is at baseline.  Psychiatric:        Mood and Affect: Mood normal.        Behavior: Behavior normal.        Thought Content: Thought content normal.        Judgment: Judgment normal.     No results found for: TSH Lab Results  Component Value Date   WBC 7.9 02/02/2017   HGB 13.8 02/02/2017   HCT 40.3 02/02/2017   MCV 90 02/02/2017    PLT 347 02/02/2017   Lab Results  Component Value Date   NA 144 02/02/2017   K 5.0 02/02/2017   CO2 27 02/02/2017   GLUCOSE 89 02/02/2017   BUN 10 02/02/2017   CREATININE 0.83 02/02/2017   BILITOT 0.3 02/02/2017   ALKPHOS 81 02/02/2017   AST 17 02/02/2017   ALT 16 02/02/2017   PROT 6.8 02/02/2017   ALBUMIN 4.2 02/02/2017   CALCIUM 8.6 (L) 02/02/2017   Lab Results  Component Value Date   CHOL 197 02/02/2017   Lab Results  Component Value Date   HDL 65 02/02/2017   Lab Results  Component Value Date   LDLCALC 106 (H) 02/02/2017   Lab Results  Component Value Date   TRIG 131 02/02/2017   Lab Results  Component Value Date   CHOLHDL 3.0 02/02/2017   No results found for: HGBA1C      

## 2019-01-29 NOTE — Patient Instructions (Signed)
Preventive Care 67-53 Years Old, Female Preventive care refers to visits with your health care provider and lifestyle choices that can promote health and wellness. This includes:  A yearly physical exam. This may also be called an annual well check.  Regular dental visits and eye exams.  Immunizations.  Screening for certain conditions.  Healthy lifestyle choices, such as eating a healthy diet, getting regular exercise, not using drugs or products that contain nicotine and tobacco, and limiting alcohol use. What can I expect for my preventive care visit? Physical exam Your health care provider will check your:  Height and weight. This may be used to calculate body mass index (BMI), which tells if you are at a healthy weight.  Heart rate and blood pressure.  Skin for abnormal spots. Counseling Your health care provider may ask you questions about your:  Alcohol, tobacco, and drug use.  Emotional well-being.  Home and relationship well-being.  Sexual activity.  Eating habits.  Work and work Statistician.  Method of birth control.  Menstrual cycle.  Pregnancy history. What immunizations do I need?  Influenza (flu) vaccine  This is recommended every year. Tetanus, diphtheria, and pertussis (Tdap) vaccine  You may need a Td booster every 10 years. Varicella (chickenpox) vaccine  You may need this if you have not been vaccinated. Zoster (shingles) vaccine  You may need this after age 53. Measles, mumps, and rubella (MMR) vaccine  You may need at least one dose of MMR if you were born in 1957 or later. You may also need a second dose. Pneumococcal conjugate (PCV13) vaccine  You may need this if you have certain conditions and were not previously vaccinated. Pneumococcal polysaccharide (PPSV23) vaccine  You may need one or two doses if you smoke cigarettes or if you have certain conditions. Meningococcal conjugate (MenACWY) vaccine  You may need this if you  have certain conditions. Hepatitis A vaccine  You may need this if you have certain conditions or if you travel or work in places where you may be exposed to hepatitis A. Hepatitis B vaccine  You may need this if you have certain conditions or if you travel or work in places where you may be exposed to hepatitis B. Haemophilus influenzae type b (Hib) vaccine  You may need this if you have certain conditions. Human papillomavirus (HPV) vaccine  If recommended by your health care provider, you may need three doses over 6 months. You may receive vaccines as individual doses or as more than one vaccine together in one shot (combination vaccines). Talk with your health care provider about the risks and benefits of combination vaccines. What tests do I need? Blood tests  Lipid and cholesterol levels. These may be checked every 5 years, or more frequently if you are over 53 years old.  Hepatitis C test.  Hepatitis B test. Screening  Lung cancer screening. You may have this screening every year starting at age 35 if you have a 30-pack-year history of smoking and currently smoke or have quit within the past 15 years.  Colorectal cancer screening. All adults should have this screening starting at age 53 and continuing until age 41. Your health care provider may recommend screening at age 26 if you are at increased risk. You will have tests every 1-10 years, depending on your results and the type of screening test.  Diabetes screening. This is done by checking your blood sugar (glucose) after you have not eaten for a while (fasting). You may have this  done every 1-3 years.  Mammogram. This may be done every 1-2 years. Talk with your health care provider about when you should start having regular mammograms. This may depend on whether you have a family history of breast cancer.  BRCA-related cancer screening. This may be done if you have a family history of breast, ovarian, tubal, or peritoneal  cancers.  Pelvic exam and Pap test. This may be done every 3 years starting at age 53. Starting at age 32, this may be done every 5 years if you have a Pap test in combination with an HPV test. Other tests  Sexually transmitted disease (STD) testing.  Bone density scan. This is done to screen for osteoporosis. You may have this scan if you are at high risk for osteoporosis. Follow these instructions at home: Eating and drinking  Eat a diet that includes fresh fruits and vegetables, whole grains, lean protein, and low-fat dairy.  Take vitamin and mineral supplements as recommended by your health care provider.  Do not drink alcohol if: ? Your health care provider tells you not to drink. ? You are pregnant, may be pregnant, or are planning to become pregnant.  If you drink alcohol: ? Limit how much you have to 0-1 drink a day. ? Be aware of how much alcohol is in your drink. In the [place] one drink equals one 12 oz bottle of beer (355 mL), one 5 oz glass of wine (148 mL), or one 1 oz glass of hard liquor (44 mL). Lifestyle  Take daily care of your teeth and gums.  Stay active. Exercise for at least 30 minutes on 5 or more days each week.  Do not use any products that contain nicotine or tobacco, such as cigarettes, e-cigarettes, and chewing tobacco. If you need help quitting, ask your health care provider.  If you are sexually active, practice safe sex. Use a condom or other form of birth control (contraception) in order to prevent pregnancy and STIs (sexually transmitted infections).  If told by your health care provider, take low-dose aspirin daily starting at age 32. What's next?  Visit your health care provider once a year for a well check visit.  Ask your health care provider how often you should have your eyes and teeth checked.  Stay up to date on all vaccines. This information is not intended to replace advice given to you by your health care provider. Make sure you  discuss any questions you have with your health care provider. Document Released: 02/25/2015 Document Revised: 10/10/2017 Document Reviewed: 10/10/2017 Elsevier Patient Education  2020 [employer].

## 2019-01-30 LAB — CMP14+EGFR
ALT: 20 IU/L (ref 0–32)
AST: 21 IU/L (ref 0–40)
Albumin/Globulin Ratio: 1.6 (ref 1.2–2.2)
Albumin: 4.2 g/dL (ref 3.8–4.9)
Alkaline Phosphatase: 97 IU/L (ref 39–117)
BUN/Creatinine Ratio: 19 (ref 9–23)
BUN: 14 mg/dL (ref 6–24)
Bilirubin Total: 0.2 mg/dL (ref 0.0–1.2)
CO2: 28 mmol/L (ref 20–29)
Calcium: 8.6 mg/dL — ABNORMAL LOW (ref 8.7–10.2)
Chloride: 102 mmol/L (ref 96–106)
Creatinine, Ser: 0.72 mg/dL (ref 0.57–1.00)
GFR calc Af Amer: 111 mL/min/{1.73_m2} (ref >59)
GFR calc non Af Amer: 96 mL/min/{1.73_m2} (ref >59)
Globulin, Total: 2.7 g/dL (ref 1.5–4.5)
Glucose: 93 mg/dL (ref 65–99)
Potassium: 5.1 mmol/L (ref 3.5–5.2)
Sodium: 143 mmol/L (ref 134–144)
Total Protein: 6.9 g/dL (ref 6.0–8.5)

## 2019-01-30 LAB — CBC WITH DIFFERENTIAL/PLATELET
Basophils Absolute: 0.1 10*3/uL (ref 0.0–0.2)
Basos: 1 %
EOS (ABSOLUTE): 0.3 10*3/uL (ref 0.0–0.4)
Eos: 3 %
Hematocrit: 40.4 % (ref 34.0–46.6)
Hemoglobin: 13.6 g/dL (ref 11.1–15.9)
Immature Grans (Abs): 0 10*3/uL (ref 0.0–0.1)
Immature Granulocytes: 0 %
Lymphocytes Absolute: 2.1 10*3/uL (ref 0.7–3.1)
Lymphs: 22 %
MCH: 30.5 pg (ref 26.6–33.0)
MCHC: 33.7 g/dL (ref 31.5–35.7)
MCV: 91 fL (ref 79–97)
Monocytes Absolute: 0.6 10*3/uL (ref 0.1–0.9)
Monocytes: 7 %
Neutrophils Absolute: 6.4 10*3/uL (ref 1.4–7.0)
Neutrophils: 67 %
Platelets: 352 10*3/uL (ref 150–450)
RBC: 4.46 x10E6/uL (ref 3.77–5.28)
RDW: 12 % (ref 11.7–15.4)
WBC: 9.5 10*3/uL (ref 3.4–10.8)

## 2019-01-30 LAB — LIPID PANEL
Chol/HDL Ratio: 3.1 ratio (ref 0.0–4.4)
Cholesterol, Total: 224 mg/dL — ABNORMAL HIGH (ref 100–199)
HDL: 73 mg/dL (ref >39)
LDL Chol Calc (NIH): 123 mg/dL — ABNORMAL HIGH (ref 0–99)
Triglycerides: 161 mg/dL — ABNORMAL HIGH (ref 0–149)
VLDL Cholesterol Cal: 28 mg/dL (ref 5–40)

## 2019-01-30 LAB — TSH: TSH: 2.97 u[IU]/mL (ref 0.450–4.500)

## 2019-02-13 ENCOUNTER — Ambulatory Visit (INDEPENDENT_AMBULATORY_CARE_PROVIDER_SITE_OTHER)
Admission: RE | Admit: 2019-02-13 | Discharge: 2019-02-13 | Disposition: A | Discharge status: Other Acute Inpatient Hospital | Payer: BC Managed Care – PPO | Source: Ambulatory Visit

## 2019-02-13 ENCOUNTER — Ambulatory Visit
Admission: EM | Admit: 2019-02-13 | Discharge: 2019-02-13 | Disposition: A | Discharge status: Home / Self Care | Payer: BC Managed Care – PPO | Attending: Emergency Medicine | Admitting: Emergency Medicine

## 2019-02-13 DIAGNOSIS — R0981 Nasal congestion: Secondary | ICD-10-CM

## 2019-02-13 DIAGNOSIS — Z20828 Contact with and (suspected) exposure to other viral communicable diseases: Secondary | ICD-10-CM | POA: Diagnosis not present

## 2019-02-13 DIAGNOSIS — R519 Headache, unspecified: Secondary | ICD-10-CM | POA: Diagnosis not present

## 2019-02-13 DIAGNOSIS — B349 Viral infection, unspecified: Secondary | ICD-10-CM | POA: Diagnosis not present

## 2019-02-13 DIAGNOSIS — R05 Cough: Secondary | ICD-10-CM

## 2019-02-13 DIAGNOSIS — R059 Cough, unspecified: Secondary | ICD-10-CM

## 2019-02-13 DIAGNOSIS — Z20822 Contact with and (suspected) exposure to covid-19: Secondary | ICD-10-CM

## 2019-02-13 DIAGNOSIS — R0989 Other specified symptoms and signs involving the circulatory and respiratory systems: Secondary | ICD-10-CM

## 2019-02-13 MED ORDER — PROMETHAZINE-DM 6.25-15 MG/5ML PO SYRP: 5.0000 mL | ORAL_SOLUTION | Freq: Every evening | ORAL | 0 refills | Status: DC | PRN

## 2019-02-13 MED ORDER — BENZONATATE 100 MG PO CAPS: 100.0000 mg | ORAL_CAPSULE | Freq: Three times a day (TID) | ORAL | 0 refills | Status: DC | PRN

## 2019-02-13 NOTE — Discharge Instructions (Addendum)
We will manage this as a viral syndrome. For sore throat or cough try using a honey-based tea. Use 3 teaspoons of honey with juice squeezed from half lemon. Place shaved pieces of ginger into 1/2-1 cup of water and warm over stove top. Then mix the ingredients and repeat every 4 hours as needed. Please take Tylenol 500mg  every 6 hours. Hydrate very well with at least 2 liters of water. Eat light meals such as soups to replenish electrolytes and soft fruits, veggies. Start an antihistamine like Zyrtec (cetirizine) at 10mg  daily for postnasal drainage, sinus congestion.  You can take this together with pseudoephedrine (Sudafed) at a dose of 30 mg 3 times a day or twice daily as needed for the same kind of congestion.

## 2019-02-13 NOTE — ED Provider Notes (Signed)
Virtual Visit via Video Note:  Virginia Black  initiated request for Telemedicine visit with [facility] team. I connected with Marquette Saa  on 02/13/2019 at 2:54 PM  for a synchronized telemedicine visit using a video enabled HIPPA compliant telemedicine application. I verified that I am speaking with Marquette Saa  using two identifiers. Jaynee Eagles, PA-C  was physically located in a [facility] Urgent care site and ELLINORE GATEWOOD was located at a different location.   The limitations of evaluation and management by telemedicine as well as the availability of in-person appointments were discussed. Patient was informed that she  may incur a bill ( including co-pay) for this virtual visit encounter. Virginia Black  expressed understanding and gave verbal consent to proceed with virtual visit.     History of Present Illness:Virginia Black  is a 54 y.o. female presents with ~2 day hx of acute onset moderate malaise including ROS below. Has tried Mucinex, elderberry tea. Had exposure with family to [place].   Review of Systems  Constitutional: Positive for chills and malaise/fatigue. Negative for fever.  HENT: Positive for congestion. Negative for ear pain, sinus pain and sore throat.   Eyes: Negative for discharge and redness.  Respiratory: Positive for cough. Negative for hemoptysis, shortness of breath and wheezing.   Cardiovascular: Negative for chest pain.  Gastrointestinal: Negative for abdominal pain, diarrhea, nausea and vomiting.  Genitourinary: Negative for dysuria, flank pain and hematuria.  Musculoskeletal: Negative for myalgias.  Skin: Negative for rash.  Neurological: Positive for headaches. Negative for dizziness and weakness.  Psychiatric/Behavioral: Negative for depression and substance abuse.    No current facility-administered medications for this encounter.   Current Outpatient Medications  Medication Sig Dispense Refill  . buPROPion (WELLBUTRIN XL) 150 MG 24 hr tablet  Take 1 tablet (150 mg total) by mouth daily. 90 tablet 3  . DULoxetine (CYMBALTA) 60 MG capsule Take 1 capsule (60 mg total) by mouth daily. 90 capsule 3  . loratadine (CLARITIN) 10 MG tablet Take by mouth.    . Multiple Vitamin (THERA) TABS Take by mouth.    . Omega-3 Fatty Acids (FISH OIL) 1000 MG CAPS Take by mouth.    . Turmeric 500 MG CAPS Take 1 capsule by mouth daily.       Allergies  Allergen Reactions  . Erythromycin Nausea And Vomiting     Past Medical History:  Diagnosis Date  . Allergy   . Anxiety   . Sleep apnea    uses CPAP     Past Surgical History:  Procedure Laterality Date  . CYST EXCISION  2009   chest   . DILATION AND EVACUATION  2002      Observations/Objective:   Physical Exam Constitutional:      General: She is not in acute distress.    Appearance: Normal appearance. She is well-developed. She is not ill-appearing, toxic-appearing or diaphoretic.  Eyes:     Extraocular Movements: Extraocular movements intact.  Pulmonary:     Effort: Pulmonary effort is normal.  Neurological:     General: No focal deficit present.     Mental Status: She is alert and oriented to person, place, and time.  Psychiatric:        Mood and Affect: Mood normal.        Behavior: Behavior normal.        Thought Content: Thought content normal.        Judgment: Judgment normal.  Assessment and Plan:  1. Viral illness   2. Acute nonintractable headache, unspecified headache type   3. Cough   4. Nasal congestion   5. Chest congestion   6. Close exposure to COVID-19 virus    Patient is to present to the [facility] urgent care at [facility] for nurse visit to complete her Covid testing.  Otherwise, will manage for viral illness such as viral URI, viral rhinitis, possible COVID-19. Counseled patient on nature of COVID-19 including modes of transmission, diagnostic testing, management and supportive care.  Offered symptomatic relief. COVID 19 testing is pending. Counseled  patient on potential for adverse effects with medications prescribed/recommended today, ER and return-to-clinic precautions discussed, patient verbalized understanding.     Follow Up Instructions:    I discussed the assessment and treatment plan with the patient. The patient was provided an opportunity to ask questions and all were answered. The patient agreed with the plan and demonstrated an understanding of the instructions.   The patient was advised to call back or seek an in-person evaluation if the symptoms worsen or if the condition fails to improve as anticipated.  I provided 15 minutes of non-face-to-face time during this encounter.    Jaynee Eagles, PA-C  02/13/2019 2:54 PM         Jaynee Eagles, PA-C 02/13/19 [phone]

## 2019-02-13 NOTE — ED Triage Notes (Signed)
Pt presents for COVID PCR testing after a televisit earlier today.

## 2019-02-15 ENCOUNTER — Encounter: Payer: Self-pay | Admitting: Family Medicine

## 2019-02-15 LAB — NOVEL CORONAVIRUS, NAA: SARS-CoV-2, NAA: NOT DETECTED

## 2019-02-16 ENCOUNTER — Telehealth: Payer: Self-pay

## 2019-02-16 NOTE — Telephone Encounter (Signed)
Spoke with patient - patient's husband tested positive on 02/13/19, however patient tested negative. Patient's PCP suggested a re-test - explained to patient a re-test is not necessary, patient should assume she is positive due to + exposure and symptoms. Patient advised to seek further care if symptoms worsen and currently to treat symptoms and quarantine as if she is +. Also advised children, who live with patient and + husband, to quarantine as well and wear a mask, avoid common areas especially bathrooms, bedrooms, etc. Patient agreed and understood.

## 2019-02-17 ENCOUNTER — Encounter: Payer: Self-pay | Admitting: Family Medicine

## 2019-11-26 ENCOUNTER — Other Ambulatory Visit: Payer: Self-pay | Admitting: Family Medicine

## 2019-11-26 DIAGNOSIS — Z1231 Encounter for screening mammogram for malignant neoplasm of breast: Secondary | ICD-10-CM

## 2020-02-08 ENCOUNTER — Ambulatory Visit: Payer: BC Managed Care – PPO | Admitting: Family Medicine

## 2020-02-10 ENCOUNTER — Ambulatory Visit
Admission: RE | Admit: 2020-02-10 | Discharge: 2020-02-10 | Disposition: A | Discharge status: Home / Self Care | Payer: 59 | Source: Ambulatory Visit | Attending: Family Medicine | Admitting: Family Medicine

## 2020-02-10 ENCOUNTER — Other Ambulatory Visit: Payer: Self-pay

## 2020-02-10 DIAGNOSIS — Z1231 Encounter for screening mammogram for malignant neoplasm of breast: Secondary | ICD-10-CM

## 2020-06-22 ENCOUNTER — Ambulatory Visit (INDEPENDENT_AMBULATORY_CARE_PROVIDER_SITE_OTHER): Payer: 59 | Admitting: Family Medicine

## 2020-06-22 ENCOUNTER — Other Ambulatory Visit: Payer: Self-pay

## 2020-06-22 ENCOUNTER — Encounter: Payer: Self-pay | Admitting: Family Medicine

## 2020-06-22 VITALS — BP 119/73 | HR 76 | Temp 97.5°F | Ht 68.0 in | Wt 204.6 lb

## 2020-06-22 DIAGNOSIS — F339 Major depressive disorder, recurrent, unspecified: Secondary | ICD-10-CM | POA: Diagnosis not present

## 2020-06-22 DIAGNOSIS — J302 Other seasonal allergic rhinitis: Secondary | ICD-10-CM | POA: Diagnosis not present

## 2020-06-22 DIAGNOSIS — Z0001 Encounter for general adult medical examination with abnormal findings: Secondary | ICD-10-CM | POA: Diagnosis not present

## 2020-06-22 DIAGNOSIS — F419 Anxiety disorder, unspecified: Secondary | ICD-10-CM | POA: Diagnosis not present

## 2020-06-22 DIAGNOSIS — G250 Essential tremor: Secondary | ICD-10-CM

## 2020-06-22 DIAGNOSIS — Z0000 Encounter for general adult medical examination without abnormal findings: Secondary | ICD-10-CM

## 2020-06-22 MED ORDER — BUPROPION HCL ER (XL) 150 MG PO TB24: 150.0000 mg | ORAL_TABLET | Freq: Every day | ORAL | 11 refills | Status: DC

## 2020-06-22 MED ORDER — DULOXETINE HCL 60 MG PO CPEP: 60.0000 mg | ORAL_CAPSULE | Freq: Every day | ORAL | 11 refills | Status: DC

## 2020-06-22 NOTE — Progress Notes (Signed)
Assessment & Plan:  1. Well adult exam Preventive health education provided. Declined pap smear, shingrix, hepatitis C and HIV screening.  - CBC with Differential/Platelet - CMP14+EGFR - Lipid panel - TSH  2. Anxiety Well controlled on current regimen.  - DULoxetine (CYMBALTA) 60 MG capsule; Take 1 capsule (60 mg total) by mouth daily.  Dispense: 30 capsule; Refill: 11 - buPROPion (WELLBUTRIN XL) 150 MG 24 hr tablet; Take 1 tablet (150 mg total) by mouth daily.  Dispense: 30 tablet; Refill: 11 - CMP14+EGFR  3. Depression, recurrent (HCC) Well controlled on current regimen.  - DULoxetine (CYMBALTA) 60 MG capsule; Take 1 capsule (60 mg total) by mouth daily.  Dispense: 30 capsule; Refill: 11 - buPROPion (WELLBUTRIN XL) 150 MG 24 hr tablet; Take 1 tablet (150 mg total) by mouth daily.  Dispense: 30 tablet; Refill: 11 - CMP14+EGFR  4. Seasonal allergies Well controlled on current regimen.   5. Essential tremor Education provided on essential tremors. States it is not bad enough to do anything about it.    Follow-up: Return in about 1 year (around 06/22/2021) for annual physical.   Deliah Boston, MSN, APRN, FNP-C [facility]  Subjective:  Patient ID: Virginia Black, female    DOB: [birth year]  Age: 55 y.o. MRN: [id number]  Patient Care Team: Olivia Mackie, MD as Consulting Physician (Obstetrics and Gynecology)   CC:  Chief Complaint  Patient presents with  . Annual Exam    HPI Virginia Black presents for her annual physical.  Occupation: Materials engineer, Marital status: married, Substance use: none Diet: healthy, Exercise: walking 1 mile daily Last eye exam: 2 years ago Last dental exam: 4 months ago Last colonoscopy: completed 2019 (normal) Last mammogram: 02/10/2020 Last pap smear: completed in 2016 Refills needed today: yes Immunizations: Flu Vaccine: not flu season Tdap Vaccine: up-to-date Shingrix Vaccine: declined COVID Vaccines:  up-to-date  GAD 7 : Generalized Anxiety Score 06/22/2020 01/29/2019  Nervous, Anxious, on Edge 1 1  Control/stop worrying 0 0  Worry too much - different things 0 1  Trouble relaxing 1 0  Restless 0 0  Easily annoyed or irritable 1 1  Afraid - awful might happen 0 0  Total GAD 7 Score 3 3  Anxiety Difficulty Not difficult at all Not difficult at all   Depression screen [facility] 2/9 06/22/2020 01/29/2019 03/27/2018  Decreased Interest 0 0 0  Down, Depressed, Hopeless 0 0 0  PHQ - 2 Score 0 0 0  Altered sleeping 0 0 -  Tired, decreased energy 0 0 -  Change in appetite 0 0 -  Feeling bad or failure about yourself  0 0 -  Trouble concentrating 0 0 -  Moving slowly or fidgety/restless 0 0 -  Suicidal thoughts 0 0 -  PHQ-9 Score 0 0 -  Difficult doing work/chores Not difficult at all Not difficult at all -    Review of Systems  Constitutional: Negative for chills, fever, malaise/fatigue and weight loss.  HENT: Negative for congestion, ear discharge, ear pain, nosebleeds, sinus pain, sore throat and tinnitus.   Eyes: Negative for blurred vision, double vision, pain, discharge and redness.  Respiratory: Negative for cough, shortness of breath and wheezing.   Cardiovascular: Negative for chest pain, palpitations and leg swelling.  Gastrointestinal: Negative for abdominal pain, constipation, diarrhea, heartburn, nausea and vomiting.  Genitourinary: Negative for dysuria, frequency and urgency.  Musculoskeletal: Negative for myalgias.  Skin: Negative for rash.  Neurological: Positive for tremors (with  action). Negative for dizziness, seizures, weakness and headaches.  Psychiatric/Behavioral: Negative for depression, substance abuse and suicidal ideas. The patient is not nervous/anxious.     Current Outpatient Medications:  .  buPROPion (WELLBUTRIN XL) 150 MG 24 hr tablet, Take 1 tablet (150 mg total) by mouth daily., Disp: 90 tablet, Rfl: 3 .  DULoxetine (CYMBALTA) 60 MG capsule, Take 1  capsule (60 mg total) by mouth daily., Disp: 90 capsule, Rfl: 3 .  loratadine (CLARITIN) 10 MG tablet, Take by mouth., Disp: , Rfl:  .  Multiple Vitamin (THERA) TABS, Take by mouth., Disp: , Rfl:  .  Omega-3 Fatty Acids (FISH OIL) 1000 MG CAPS, Take by mouth., Disp: , Rfl:  .  Turmeric 500 MG CAPS, Take 1 capsule by mouth daily., Disp: , Rfl:   Allergies  Allergen Reactions  . Erythromycin Nausea And Vomiting    Past Medical History:  Diagnosis Date  . Allergy   . Anxiety   . Sleep apnea    uses CPAP     Past Surgical History:  Procedure Laterality Date  . CYST EXCISION  2009   chest   . DILATION AND EVACUATION  2002    Family History  Problem Relation Age of Onset  . Hyperlipidemia Mother   . High blood pressure Mother   . Hyperlipidemia Father   . High blood pressure Father   . Diabetes Maternal Aunt   . Stroke Maternal Grandmother   . Emphysema Paternal Grandfather   . Colon cancer Neg Hx   . Esophageal cancer Neg Hx   . Stomach cancer Neg Hx     Social History   Socioeconomic History  . Marital status: Married    Spouse name: Not on file  . Number of children: Not on file  . Years of education: Not on file  . Highest education level: Not on file  Occupational History  . Not on file  Tobacco Use  . Smoking status: Former Smoker    Types: Cigarettes    Quit date: 02/13/2000    Years since quitting: 20.3  . Smokeless tobacco: Never Used  Vaping Use  . Vaping Use: Never used  Substance and Sexual Activity  . Alcohol use: Yes    Alcohol/week: 7.0 standard drinks    Types: 7 Glasses of wine per week    Comment: red wine every day  per pt   . Drug use: No  . Sexual activity: Yes  Other Topics Concern  . Not on file  Social History Narrative  . Not on file   Social Determinants of Health   Financial Resource Strain: Not on file  Food Insecurity: Not on file  Transportation Needs: Not on file  Physical Activity: Not on file  Stress: Not on file   Social Connections: Not on file  Intimate Partner Violence: Not on file      Objective:    BP 119/73   Pulse 76   Temp (!) 97.5 F (36.4 C) (Temporal)   Ht $R'5\' 8"'cg$  (1.727 m)   Wt 204 lb 9.6 oz (92.8 kg)   SpO2 100%   BMI 31.11 kg/m   Wt Readings from Last 3 Encounters:  06/22/20 204 lb 9.6 oz (92.8 kg)  01/29/19 207 lb 3.2 oz (94 kg)  03/27/18 206 lb 3.2 oz (93.5 kg)    Physical Exam Vitals reviewed.  Constitutional:      General: She is not in acute distress.    Appearance: Normal appearance. She is  obese. She is not ill-appearing, toxic-appearing or diaphoretic.  HENT:     Head: Normocephalic and atraumatic.     Right Ear: Tympanic membrane, ear canal and external ear normal. There is no impacted cerumen.     Left Ear: Tympanic membrane, ear canal and external ear normal. There is no impacted cerumen.     Nose: Nose normal. No congestion or rhinorrhea.     Mouth/Throat:     Mouth: Mucous membranes are moist.     Pharynx: Oropharynx is clear. No oropharyngeal exudate or posterior oropharyngeal erythema.  Eyes:     General: No scleral icterus.       Right eye: No discharge.        Left eye: No discharge.     Conjunctiva/sclera: Conjunctivae normal.     Pupils: Pupils are equal, round, and reactive to light.  Cardiovascular:     Rate and Rhythm: Normal rate and regular rhythm.     Heart sounds: Normal heart sounds. No murmur heard. No friction rub. No gallop.   Pulmonary:     Effort: Pulmonary effort is normal. No respiratory distress.     Breath sounds: Normal breath sounds. No stridor. No wheezing, rhonchi or rales.  Abdominal:     General: Abdomen is flat. Bowel sounds are normal. There is no distension.     Palpations: Abdomen is soft. There is no mass.     Tenderness: There is no abdominal tenderness. There is no guarding or rebound.     Hernia: No hernia is present.  Musculoskeletal:        General: Normal range of motion.     Cervical back: Normal range  of motion and neck supple. No rigidity. No muscular tenderness.  Lymphadenopathy:     Cervical: No cervical adenopathy.  Skin:    General: Skin is warm and dry.     Capillary Refill: Capillary refill takes less than 2 seconds.  Neurological:     General: No focal deficit present.     Mental Status: She is alert and oriented to person, place, and time. Mental status is at baseline.  Psychiatric:        Mood and Affect: Mood normal.        Behavior: Behavior normal.        Thought Content: Thought content normal.        Judgment: Judgment normal.     Lab Results  Component Value Date   TSH 2.970 01/29/2019   Lab Results  Component Value Date   WBC 9.5 01/29/2019   HGB 13.6 01/29/2019   HCT 40.4 01/29/2019   MCV 91 01/29/2019   PLT 352 01/29/2019   Lab Results  Component Value Date   NA 143 01/29/2019   K 5.1 01/29/2019   CO2 28 01/29/2019   GLUCOSE 93 01/29/2019   BUN 14 01/29/2019   CREATININE 0.72 01/29/2019   BILITOT 0.2 01/29/2019   ALKPHOS 97 01/29/2019   AST 21 01/29/2019   ALT 20 01/29/2019   PROT 6.9 01/29/2019   ALBUMIN 4.2 01/29/2019   CALCIUM 8.6 (L) 01/29/2019   Lab Results  Component Value Date   CHOL 224 (H) 01/29/2019   Lab Results  Component Value Date   HDL 73 01/29/2019   Lab Results  Component Value Date   LDLCALC 123 (H) 01/29/2019   Lab Results  Component Value Date   TRIG 161 (H) 01/29/2019   Lab Results  Component Value Date   CHOLHDL 3.1 01/29/2019  No results found for: HGBA1C

## 2020-06-22 NOTE — Patient Instructions (Signed)
Essential Tremor A tremor is trembling or shaking that a person cannot control. Most tremors affect the hands or arms. Tremors can also affect the head, vocal cords, legs, and other parts of the body. Essential tremor is a tremor without a known cause. Usually, it occurs while a person is trying to perform an action. It tends to get worse gradually as a person ages. What are the causes? The cause of this condition is not known. What increases the risk? You are more likely to develop this condition if:  You have a family member with essential tremor.  You are age 40 or older.  You take certain medicines. What are the signs or symptoms? The main sign of a tremor is a rhythmic shaking of certain parts of your body that is uncontrolled and unintentional. You may:  Have difficulty eating with a spoon or fork.  Have difficulty writing.  Nod your head up and down or side to side.  Have a quivering voice. The shaking may:  Get worse over time.  Come and go.  Be more noticeable on one side of your body.  Get worse due to stress, fatigue, caffeine, and extreme heat or cold. How is this diagnosed? This condition may be diagnosed based on:  Your symptoms and medical history.  A physical exam. There is no single test to diagnose an essential tremor. However, your health care provider may order tests to rule out other causes of your condition. These may include:  Blood and urine tests.  Imaging studies of your brain, such as CT scan and MRI.  A test that measures involuntary muscle movement (electromyogram).   How is this treated? Treatment for essential tremor depends on the severity of the condition.  Some tremors may go away without treatment.  Mild tremors may not need treatment if they do not affect your day-to-day life.  Severe tremors may need to be treated using one or more of the following options: ? Medicines. ? Lifestyle changes. ? Occupational or physical  therapy. Follow these instructions at home: Lifestyle  Do not use any products that contain nicotine or tobacco, such as cigarettes and e-cigarettes. If you need help quitting, ask your health care provider.  Limit your caffeine intake as told by your health care provider.  Try to get 8 hours of sleep each night.  Find ways to manage your stress that fits your lifestyle and personality. Consider trying meditation or yoga.  Try to anticipate stressful situations and allow extra time to manage them.  If you are struggling emotionally with the effects of your tremor, consider working with a mental health provider.   General instructions  Take over-the-counter and prescription medicines only as told by your health care provider.  Avoid extreme heat and extreme cold.  Keep all follow-up visits as told by your health care provider. This is important. Visits may include physical therapy visits. Contact a health care provider if:  You experience any changes in the location or intensity of your tremors.  You start having a tremor after starting a new medicine.  You have tremor with other symptoms, such as: ? Numbness. ? Tingling. ? Pain. ? Weakness.  Your tremor gets worse.  Your tremor interferes with your daily life.  You feel down, blue, or sad for at least 2 weeks in a row.  Worrying about your tremor and what other people think about you interferes with your everyday life functions, including relationships, work, or school. Summary  Essential tremor   is a tremor without a known cause. Usually, it occurs when you are trying to perform an action.  You are more likely to develop this condition if you have a family member with essential tremor.  The main sign of a tremor is a rhythmic shaking of certain parts of your body that is uncontrolled and unintentional.  Treatment for essential tremor depends on the severity of the condition. This information is not intended to  replace advice given to you by your health care provider. Make sure you discuss any questions you have with your health care provider. Document Revised: 10/23/2019 Document Reviewed: 10/23/2019 Elsevier Patient Education  2021 Elsevier Inc.   Preventive Care 59-59 Years Old, Female Preventive care refers to lifestyle choices and visits with your health care provider that can promote health and wellness. This includes:  A yearly physical exam. This is also called an annual wellness visit.  Regular dental and eye exams.  Immunizations.  Screening for certain conditions.  Healthy lifestyle choices, such as: ? Eating a healthy diet. ? Getting regular exercise. ? Not using drugs or products that contain nicotine and tobacco. ? Limiting alcohol use. What can I expect for my preventive care visit? Physical exam Your health care provider will check your:  Height and weight. These may be used to calculate your BMI (body mass index). BMI is a measurement that tells if you are at a healthy weight.  Heart rate and blood pressure.  Body temperature.  Skin for abnormal spots. Counseling Your health care provider may ask you questions about your:  Past medical problems.  Family's medical history.  Alcohol, tobacco, and drug use.  Emotional well-being.  Home life and relationship well-being.  Sexual activity.  Diet, exercise, and sleep habits.  Work and work Statistician.  Access to firearms.  Method of birth control.  Menstrual cycle.  Pregnancy history. What immunizations do I need? Vaccines are usually given at various ages, according to a schedule. Your health care provider will recommend vaccines for you based on your age, medical history, and lifestyle or other factors, such as travel or where you work.   What tests do I need? Blood tests  Lipid and cholesterol levels. These may be checked every 5 years, or more often if you are over 41 years old.  Hepatitis C  test.  Hepatitis B test. Screening  Lung cancer screening. You may have this screening every year starting at age 49 if you have a 30-pack-year history of smoking and currently smoke or have quit within the past 15 years.  Colorectal cancer screening. ? All adults should have this screening starting at age 36 and continuing until age 81. ? Your health care provider may recommend screening at age 6 if you are at increased risk. ? You will have tests every 1-10 years, depending on your results and the type of screening test.  Diabetes screening. ? This is done by checking your blood sugar (glucose) after you have not eaten for a while (fasting). ? You may have this done every 1-3 years.  Mammogram. ? This may be done every 1-2 years. ? Talk with your health care provider about when you should start having regular mammograms. This may depend on whether you have a family history of breast cancer.  BRCA-related cancer screening. This may be done if you have a family history of breast, ovarian, tubal, or peritoneal cancers.  Pelvic exam and Pap test. ? This may be done every 3 years starting at  age 71. ? Starting at age 20, this may be done every 5 years if you have a Pap test in combination with an HPV test. Other tests  STD (sexually transmitted disease) testing, if you are at risk.  Bone density scan. This is done to screen for osteoporosis. You may have this scan if you are at high risk for osteoporosis. Talk with your health care provider about your test results, treatment options, and if necessary, the need for more tests. Follow these instructions at home: Eating and drinking  Eat a diet that includes fresh fruits and vegetables, whole grains, lean protein, and low-fat dairy products.  Take vitamin and mineral supplements as recommended by your health care provider.  Do not drink alcohol if: ? Your health care provider tells you not to drink. ? You are pregnant, may be  pregnant, or are planning to become pregnant.  If you drink alcohol: ? Limit how much you have to 0-1 drink a day. ? Be aware of how much alcohol is in your drink. In the [place] one drink equals one 12 oz bottle of beer (355 mL), one 5 oz glass of wine (148 mL), or one 1 oz glass of hard liquor (44 mL).   Lifestyle  Take daily care of your teeth and gums. Brush your teeth every morning and night with fluoride toothpaste. Floss one time each day.  Stay active. Exercise for at least 30 minutes 5 or more days each week.  Do not use any products that contain nicotine or tobacco, such as cigarettes, e-cigarettes, and chewing tobacco. If you need help quitting, ask your health care provider.  Do not use drugs.  If you are sexually active, practice safe sex. Use a condom or other form of protection to prevent STIs (sexually transmitted infections).  If you do not wish to become pregnant, use a form of birth control. If you plan to become pregnant, see your health care provider for a prepregnancy visit.  If told by your health care provider, take low-dose aspirin daily starting at age 75.  Find healthy ways to cope with stress, such as: ? Meditation, yoga, or listening to music. ? Journaling. ? Talking to a trusted person. ? Spending time with friends and family. Safety  Always wear your seat belt while driving or riding in a vehicle.  Do not drive: ? If you have been drinking alcohol. Do not ride with someone who has been drinking. ? When you are tired or distracted. ? While texting.  Wear a helmet and other protective equipment during sports activities.  If you have firearms in your house, make sure you follow all gun safety procedures. What's next?  Visit your health care provider once a year for an annual wellness visit.  Ask your health care provider how often you should have your eyes and teeth checked.  Stay up to date on all vaccines. This information is not intended to  replace advice given to you by your health care provider. Make sure you discuss any questions you have with your health care provider. Document Revised: 11/03/2019 Document Reviewed: 10/10/2017 Elsevier Patient Education  2021 [employer].

## 2020-06-23 LAB — CBC WITH DIFFERENTIAL/PLATELET
Basophils Absolute: 0.1 10*3/uL (ref 0.0–0.2)
Basos: 1 %
EOS (ABSOLUTE): 0.3 10*3/uL (ref 0.0–0.4)
Eos: 3 %
Hematocrit: 38.7 % (ref 34.0–46.6)
Hemoglobin: 13.3 g/dL (ref 11.1–15.9)
Immature Grans (Abs): 0 10*3/uL (ref 0.0–0.1)
Immature Granulocytes: 0 %
Lymphocytes Absolute: 2.2 10*3/uL (ref 0.7–3.1)
Lymphs: 22 %
MCH: 30.9 pg (ref 26.6–33.0)
MCHC: 34.4 g/dL (ref 31.5–35.7)
MCV: 90 fL (ref 79–97)
Monocytes Absolute: 0.9 10*3/uL (ref 0.1–0.9)
Monocytes: 9 %
Neutrophils Absolute: 6.3 10*3/uL (ref 1.4–7.0)
Neutrophils: 65 %
Platelets: 330 10*3/uL (ref 150–450)
RBC: 4.3 x10E6/uL (ref 3.77–5.28)
RDW: 12.2 % (ref 11.7–15.4)
WBC: 9.8 10*3/uL (ref 3.4–10.8)

## 2020-06-23 LAB — LIPID PANEL
Chol/HDL Ratio: 3 ratio (ref 0.0–4.4)
Cholesterol, Total: 212 mg/dL — ABNORMAL HIGH (ref 100–199)
HDL: 71 mg/dL (ref >39)
LDL Chol Calc (NIH): 117 mg/dL — ABNORMAL HIGH (ref 0–99)
Triglycerides: 137 mg/dL (ref 0–149)
VLDL Cholesterol Cal: 24 mg/dL (ref 5–40)

## 2020-06-23 LAB — CMP14+EGFR
ALT: 24 IU/L (ref 0–32)
AST: 23 IU/L (ref 0–40)
Albumin/Globulin Ratio: 1.4 (ref 1.2–2.2)
Albumin: 4 g/dL (ref 3.8–4.9)
Alkaline Phosphatase: 92 IU/L (ref 44–121)
BUN/Creatinine Ratio: 12 (ref 9–23)
BUN: 9 mg/dL (ref 6–24)
Bilirubin Total: 0.3 mg/dL (ref 0.0–1.2)
CO2: 26 mmol/L (ref 20–29)
Calcium: 8.3 mg/dL — ABNORMAL LOW (ref 8.7–10.2)
Chloride: 101 mmol/L (ref 96–106)
Creatinine, Ser: 0.75 mg/dL (ref 0.57–1.00)
Globulin, Total: 2.8 g/dL (ref 1.5–4.5)
Glucose: 103 mg/dL — ABNORMAL HIGH (ref 65–99)
Potassium: 4.4 mmol/L (ref 3.5–5.2)
Sodium: 142 mmol/L (ref 134–144)
Total Protein: 6.8 g/dL (ref 6.0–8.5)
eGFR: 94 mL/min/{1.73_m2} (ref >59)

## 2020-06-23 LAB — TSH: TSH: 3.38 u[IU]/mL (ref 0.450–4.500)

## 2020-06-26 DIAGNOSIS — G250 Essential tremor: Secondary | ICD-10-CM | POA: Insufficient documentation

## 2021-07-06 ENCOUNTER — Encounter: Payer: Self-pay | Admitting: Family Medicine

## 2021-07-06 ENCOUNTER — Ambulatory Visit (INDEPENDENT_AMBULATORY_CARE_PROVIDER_SITE_OTHER): Payer: 59 | Admitting: Family Medicine

## 2021-07-06 ENCOUNTER — Other Ambulatory Visit (HOSPITAL_COMMUNITY)
Admission: RE | Admit: 2021-07-06 | Discharge: 2021-07-06 | Disposition: A | Discharge status: Home / Self Care | Payer: 59 | Source: Ambulatory Visit | Attending: Family Medicine | Admitting: Family Medicine

## 2021-07-06 VITALS — BP 135/76 | HR 84 | Temp 97.7°F | Ht 68.0 in | Wt 198.2 lb

## 2021-07-06 DIAGNOSIS — E669 Obesity, unspecified: Secondary | ICD-10-CM | POA: Diagnosis not present

## 2021-07-06 DIAGNOSIS — F339 Major depressive disorder, recurrent, unspecified: Secondary | ICD-10-CM | POA: Diagnosis not present

## 2021-07-06 DIAGNOSIS — Z0001 Encounter for general adult medical examination with abnormal findings: Secondary | ICD-10-CM | POA: Diagnosis not present

## 2021-07-06 DIAGNOSIS — Z1151 Encounter for screening for human papillomavirus (HPV): Secondary | ICD-10-CM

## 2021-07-06 DIAGNOSIS — Z124 Encounter for screening for malignant neoplasm of cervix: Secondary | ICD-10-CM | POA: Insufficient documentation

## 2021-07-06 DIAGNOSIS — Z113 Encounter for screening for infections with a predominantly sexual mode of transmission: Secondary | ICD-10-CM | POA: Diagnosis present

## 2021-07-06 DIAGNOSIS — F419 Anxiety disorder, unspecified: Secondary | ICD-10-CM

## 2021-07-06 DIAGNOSIS — Z0000 Encounter for general adult medical examination without abnormal findings: Secondary | ICD-10-CM

## 2021-07-06 MED ORDER — DULOXETINE HCL 60 MG PO CPEP: 60.0000 mg | ORAL_CAPSULE | Freq: Every day | ORAL | 11 refills | Status: DC

## 2021-07-06 MED ORDER — BUPROPION HCL ER (XL) 150 MG PO TB24: 150.0000 mg | ORAL_TABLET | Freq: Every day | ORAL | 11 refills | Status: DC

## 2021-07-06 NOTE — Progress Notes (Signed)
Assessment & Plan:  1. Well adult exam Preventive health education provided. - CBC with Differential/Platelet - CMP14+EGFR - Lipid panel  2. Anxiety Well controlled on current regimen.  - buPROPion (WELLBUTRIN XL) 150 MG 24 hr tablet; Take 1 tablet (150 mg total) by mouth daily.  Dispense: 30 tablet; Refill: 11 - DULoxetine (CYMBALTA) 60 MG capsule; Take 1 capsule (60 mg total) by mouth daily.  Dispense: 30 capsule; Refill: 11 - CMP14+EGFR  3. Depression, recurrent ([employer]) Well controlled on current regimen.  - buPROPion (WELLBUTRIN XL) 150 MG 24 hr tablet; Take 1 tablet (150 mg total) by mouth daily.  Dispense: 30 tablet; Refill: 11 - DULoxetine (CYMBALTA) 60 MG capsule; Take 1 capsule (60 mg total) by mouth daily.  Dispense: 30 capsule; Refill: 11 - CMP14+EGFR  4. Obesity (BMI 30.0-34.9) Continue healthy eating and exercise. - CBC with Differential/Platelet - CMP14+EGFR - Lipid panel  5. Screening for cervical cancer - Cytology - PAP  6. Routine screening for STI (sexually transmitted infection) - Cytology - PAP  7. Screening for human papillomavirus (HPV) - Cytology - PAP   Follow-up: Return in about 1 year (around 07/07/2022) for annual physical.   Hendricks Limes, MSN, APRN, FNP-C [facility]  Subjective:  Patient ID: Virginia Black, female    DOB: [birth year]  Age: 56 y.o. MRN: [id number]  Patient Care Team: Loman Brooklyn, FNP as PCP - General (Family Medicine) Brien Few, MD as Consulting Physician (Obstetrics and Gynecology)   CC:  Chief Complaint  Patient presents with   Gynecologic Exam    HPI Virginia Black presents for her annual physical.  Occupation: Garment/textile technologist, Marital status: married, Substance use: none Diet: healthy, Exercise: walking 1 mile daily Last eye exam: 3 years ago Last dental exam: every 6 months Last colonoscopy: 04/24/2017 with recommended repeat in 5-10 years Last mammogram: 02/10/2020 Last  pap smear: completed in 2016 Immunizations: Flu Vaccine: not flu season Tdap Vaccine: up-to-date Shingrix Vaccine: declined COVID Vaccines: declined booster     07/06/2021    3:08 PM 06/22/2020    9:47 AM 01/29/2019    8:50 AM  GAD 7 : Generalized Anxiety Score  Nervous, Anxious, on Edge 0 1 1  Control/stop worrying 0 0 0  Worry too much - different things 0 0 1  Trouble relaxing 0 1 0  Restless 0 0 0  Easily annoyed or irritable 0 1 1  Afraid - awful might happen 0 0 0  Total GAD 7 Score 0 3 3  Anxiety Difficulty Not difficult at all Not difficult at all Not difficult at all      07/06/2021    3:08 PM 06/22/2020    9:47 AM 01/29/2019    8:50 AM  Depression screen PHQ 2/9  Decreased Interest 0 0 0  Down, Depressed, Hopeless 0 0 0  PHQ - 2 Score 0 0 0  Altered sleeping 0 0 0  Tired, decreased energy 0 0 0  Change in appetite 0 0 0  Feeling bad or failure about yourself  0 0 0  Trouble concentrating 0 0 0  Moving slowly or fidgety/restless 0 0 0  Suicidal thoughts 0 0 0  PHQ-9 Score 0 0 0  Difficult doing work/chores Not difficult at all Not difficult at all Not difficult at all    Review of Systems  Constitutional:  Negative for chills, fever, malaise/fatigue and weight loss.  HENT:  Negative for congestion, ear discharge, ear pain,  nosebleeds, sinus pain, sore throat and tinnitus.   Eyes:  Negative for blurred vision, double vision, pain, discharge and redness.  Respiratory:  Negative for cough, shortness of breath and wheezing.   Cardiovascular:  Negative for chest pain, palpitations and leg swelling.  Gastrointestinal:  Negative for abdominal pain, constipation, diarrhea, heartburn, nausea and vomiting.  Genitourinary:  Negative for dysuria, frequency and urgency.  Musculoskeletal:  Negative for myalgias.  Skin:  Negative for rash.  Neurological:  Negative for dizziness, seizures, weakness and headaches.  Psychiatric/Behavioral:  Negative for depression, substance  abuse and suicidal ideas. The patient is not nervous/anxious.     Current Outpatient Medications:    buPROPion (WELLBUTRIN XL) 150 MG 24 hr tablet, Take 1 tablet (150 mg total) by mouth daily., Disp: 30 tablet, Rfl: 11   DULoxetine (CYMBALTA) 60 MG capsule, Take 1 capsule (60 mg total) by mouth daily., Disp: 30 capsule, Rfl: 11   loratadine (CLARITIN) 10 MG tablet, Take by mouth., Disp: , Rfl:    Multiple Vitamin (THERA) TABS, Take by mouth., Disp: , Rfl:    Omega-3 Fatty Acids (FISH OIL) 1000 MG CAPS, Take by mouth., Disp: , Rfl:    Turmeric 500 MG CAPS, Take 1 capsule by mouth daily., Disp: , Rfl:   Allergies  Allergen Reactions   Erythromycin Nausea And Vomiting    Past Medical History:  Diagnosis Date   Allergy    Anxiety    Sleep apnea    uses CPAP     Past Surgical History:  Procedure Laterality Date   CYST EXCISION  2009   chest    DILATION AND EVACUATION  2002    Family History  Problem Relation Age of Onset   Hyperlipidemia Mother    High blood pressure Mother    Hyperlipidemia Father    High blood pressure Father    Diabetes Maternal Aunt    Stroke Maternal Grandmother    Emphysema Paternal Grandfather    Colon cancer Neg Hx    Esophageal cancer Neg Hx    Stomach cancer Neg Hx     Social History   Socioeconomic History   Marital status: Married    Spouse name: Not on file   Number of children: Not on file   Years of education: Not on file   Highest education level: Not on file  Occupational History   Not on file  Tobacco Use   Smoking status: Former    Types: Cigarettes    Quit date: 02/13/2000    Years since quitting: 21.4   Smokeless tobacco: Never  Vaping Use   Vaping Use: Never used  Substance and Sexual Activity   Alcohol use: Yes    Alcohol/week: 7.0 standard drinks    Types: 7 Glasses of wine per week    Comment: red wine every day  per pt    Drug use: No   Sexual activity: Yes  Other Topics Concern   Not on file  Social History  Narrative   Not on file   Social Determinants of Health   Financial Resource Strain: Not on file  Food Insecurity: Not on file  Transportation Needs: Not on file  Physical Activity: Not on file  Stress: Not on file  Social Connections: Not on file  Intimate Partner Violence: Not on file      Objective:    BP 135/76   Pulse 84   Temp 97.7 F (36.5 C) (Temporal)   Ht '5\' 8"'  (1.727 m)  Wt 198 lb 3.2 oz (89.9 kg)   SpO2 100%   BMI 30.14 kg/m   Wt Readings from Last 3 Encounters:  07/06/21 198 lb 3.2 oz (89.9 kg)  06/22/20 204 lb 9.6 oz (92.8 kg)  01/29/19 207 lb 3.2 oz (94 kg)    Physical Exam Vitals reviewed. Exam conducted with a chaperone present Janett Billow).  Constitutional:      General: She is not in acute distress.    Appearance: Normal appearance. She is obese. She is not ill-appearing, toxic-appearing or diaphoretic.  HENT:     Head: Normocephalic and atraumatic.     Right Ear: Tympanic membrane, ear canal and external ear normal. There is no impacted cerumen.     Left Ear: Tympanic membrane, ear canal and external ear normal. There is no impacted cerumen.     Nose: Nose normal. No congestion or rhinorrhea.     Mouth/Throat:     Mouth: Mucous membranes are moist.     Pharynx: Oropharynx is clear. No oropharyngeal exudate or posterior oropharyngeal erythema.  Eyes:     General: No scleral icterus.       Right eye: No discharge.        Left eye: No discharge.     Conjunctiva/sclera: Conjunctivae normal.     Pupils: Pupils are equal, round, and reactive to light.  Cardiovascular:     Rate and Rhythm: Normal rate and regular rhythm.     Heart sounds: Normal heart sounds. No murmur heard.   No friction rub. No gallop.  Pulmonary:     Effort: Pulmonary effort is normal. No respiratory distress.     Breath sounds: Normal breath sounds. No stridor. No wheezing, rhonchi or rales.  Abdominal:     General: Abdomen is flat. Bowel sounds are normal. There is no  distension.     Palpations: Abdomen is soft. There is no mass.     Tenderness: There is no abdominal tenderness. There is no guarding or rebound.     Hernia: No hernia is present.  Genitourinary:    Pubic Area: No rash or pubic lice.      Labia:        Right: No rash, tenderness, lesion or injury.        Left: No rash, tenderness, lesion or injury.      Vagina: Normal.     Cervix: Normal.     Uterus: Normal.      Adnexa: Right adnexa normal and left adnexa normal.  Musculoskeletal:        General: Normal range of motion.     Cervical back: Normal range of motion and neck supple. No rigidity. No muscular tenderness.  Lymphadenopathy:     Cervical: No cervical adenopathy.  Skin:    General: Skin is warm and dry.     Capillary Refill: Capillary refill takes less than 2 seconds.  Neurological:     General: No focal deficit present.     Mental Status: She is alert and oriented to person, place, and time. Mental status is at baseline.  Psychiatric:        Mood and Affect: Mood normal.        Behavior: Behavior normal.        Thought Content: Thought content normal.        Judgment: Judgment normal.    Lab Results  Component Value Date   TSH 3.380 06/22/2020   Lab Results  Component Value Date   WBC 9.8 06/22/2020  HGB 13.3 06/22/2020   HCT 38.7 06/22/2020   MCV 90 06/22/2020   PLT 330 06/22/2020   Lab Results  Component Value Date   NA 142 06/22/2020   K 4.4 06/22/2020   CO2 26 06/22/2020   GLUCOSE 103 (H) 06/22/2020   BUN 9 06/22/2020   CREATININE 0.75 06/22/2020   BILITOT 0.3 06/22/2020   ALKPHOS 92 06/22/2020   AST 23 06/22/2020   ALT 24 06/22/2020   PROT 6.8 06/22/2020   ALBUMIN 4.0 06/22/2020   CALCIUM 8.3 (L) 06/22/2020   EGFR 94 06/22/2020   Lab Results  Component Value Date   CHOL 212 (H) 06/22/2020   Lab Results  Component Value Date   HDL 71 06/22/2020   Lab Results  Component Value Date   LDLCALC 117 (H) 06/22/2020   Lab Results   Component Value Date   TRIG 137 06/22/2020   Lab Results  Component Value Date   CHOLHDL 3.0 06/22/2020   No results found for: HGBA1C

## 2021-07-07 LAB — CMP14+EGFR
ALT: 20 IU/L (ref 0–32)
AST: 18 IU/L (ref 0–40)
Albumin/Globulin Ratio: 1.6 (ref 1.2–2.2)
Albumin: 4.2 g/dL (ref 3.8–4.9)
Alkaline Phosphatase: 87 IU/L (ref 44–121)
BUN/Creatinine Ratio: 24 — ABNORMAL HIGH (ref 9–23)
BUN: 18 mg/dL (ref 6–24)
Bilirubin Total: 0.4 mg/dL (ref 0.0–1.2)
CO2: 27 mmol/L (ref 20–29)
Calcium: 9 mg/dL (ref 8.7–10.2)
Chloride: 103 mmol/L (ref 96–106)
Creatinine, Ser: 0.74 mg/dL (ref 0.57–1.00)
Globulin, Total: 2.6 g/dL (ref 1.5–4.5)
Glucose: 90 mg/dL (ref 70–99)
Potassium: 4.4 mmol/L (ref 3.5–5.2)
Sodium: 144 mmol/L (ref 134–144)
Total Protein: 6.8 g/dL (ref 6.0–8.5)
eGFR: 95 mL/min/{1.73_m2} (ref >59)

## 2021-07-07 LAB — CBC WITH DIFFERENTIAL/PLATELET
Basophils Absolute: 0.1 10*3/uL (ref 0.0–0.2)
Basos: 1 %
EOS (ABSOLUTE): 0.3 10*3/uL (ref 0.0–0.4)
Eos: 3 %
Hematocrit: 40.1 % (ref 34.0–46.6)
Hemoglobin: 13.4 g/dL (ref 11.1–15.9)
Immature Grans (Abs): 0 10*3/uL (ref 0.0–0.1)
Immature Granulocytes: 0 %
Lymphocytes Absolute: 2.5 10*3/uL (ref 0.7–3.1)
Lymphs: 25 %
MCH: 30.7 pg (ref 26.6–33.0)
MCHC: 33.4 g/dL (ref 31.5–35.7)
MCV: 92 fL (ref 79–97)
Monocytes Absolute: 0.9 10*3/uL (ref 0.1–0.9)
Monocytes: 9 %
Neutrophils Absolute: 6.5 10*3/uL (ref 1.4–7.0)
Neutrophils: 62 %
Platelets: 345 10*3/uL (ref 150–450)
RBC: 4.37 x10E6/uL (ref 3.77–5.28)
RDW: 12.1 % (ref 11.7–15.4)
WBC: 10.2 10*3/uL (ref 3.4–10.8)

## 2021-07-07 LAB — LIPID PANEL
Chol/HDL Ratio: 3.1 ratio (ref 0.0–4.4)
Cholesterol, Total: 240 mg/dL — ABNORMAL HIGH (ref 100–199)
HDL: 77 mg/dL (ref >39)
LDL Chol Calc (NIH): 140 mg/dL — ABNORMAL HIGH (ref 0–99)
Triglycerides: 133 mg/dL (ref 0–149)
VLDL Cholesterol Cal: 23 mg/dL (ref 5–40)

## 2021-07-12 LAB — CYTOLOGY - PAP
Chlamydia: NEGATIVE
Comment: NEGATIVE
Comment: NEGATIVE
Comment: NEGATIVE
Comment: NORMAL
Diagnosis: NEGATIVE
High risk HPV: NEGATIVE
Neisseria Gonorrhea: NEGATIVE
Trichomonas: NEGATIVE

## 2021-07-19 ENCOUNTER — Other Ambulatory Visit: Payer: Self-pay | Admitting: Family Medicine

## 2021-07-19 DIAGNOSIS — Z1231 Encounter for screening mammogram for malignant neoplasm of breast: Secondary | ICD-10-CM

## 2021-08-16 ENCOUNTER — Ambulatory Visit
Admission: RE | Admit: 2021-08-16 | Discharge: 2021-08-16 | Disposition: A | Discharge status: Home / Self Care | Payer: 59 | Source: Ambulatory Visit | Attending: Family Medicine | Admitting: Family Medicine

## 2021-08-16 DIAGNOSIS — Z1231 Encounter for screening mammogram for malignant neoplasm of breast: Secondary | ICD-10-CM

## 2022-07-31 ENCOUNTER — Other Ambulatory Visit: Payer: Self-pay | Admitting: Family Medicine

## 2022-07-31 DIAGNOSIS — F419 Anxiety disorder, unspecified: Secondary | ICD-10-CM

## 2022-07-31 DIAGNOSIS — F339 Major depressive disorder, recurrent, unspecified: Secondary | ICD-10-CM

## 2022-07-31 NOTE — Telephone Encounter (Signed)
I called pt & she was on vacation. She will call us, when she gets back to schedule an appt w/ new provider. She is aware that no one will refill her x2 RX's, until she is seen. She has about one month supply left. She is aware that the last time she was seen was May 2023 w/[facility].

## 2022-07-31 NOTE — Telephone Encounter (Signed)
Virginia Black pt NTBS by new provider. NO RFs sent to pharmacy

## 2022-09-19 ENCOUNTER — Encounter: Payer: Self-pay | Admitting: Nurse Practitioner

## 2022-09-19 ENCOUNTER — Ambulatory Visit (INDEPENDENT_AMBULATORY_CARE_PROVIDER_SITE_OTHER): Payer: 59 | Admitting: Nurse Practitioner

## 2022-09-19 VITALS — BP 118/74 | HR 76 | Temp 97.6°F | Ht 68.0 in | Wt 203.6 lb

## 2022-09-19 DIAGNOSIS — Z0001 Encounter for general adult medical examination with abnormal findings: Secondary | ICD-10-CM

## 2022-09-19 DIAGNOSIS — Z1231 Encounter for screening mammogram for malignant neoplasm of breast: Secondary | ICD-10-CM | POA: Diagnosis not present

## 2022-09-19 DIAGNOSIS — F419 Anxiety disorder, unspecified: Secondary | ICD-10-CM | POA: Diagnosis not present

## 2022-09-19 DIAGNOSIS — Z0000 Encounter for general adult medical examination without abnormal findings: Secondary | ICD-10-CM

## 2022-09-19 DIAGNOSIS — F339 Major depressive disorder, recurrent, unspecified: Secondary | ICD-10-CM | POA: Diagnosis not present

## 2022-09-19 LAB — CMP14+EGFR
ALT: 22 IU/L (ref 0–32)
AST: 22 IU/L (ref 0–40)
Albumin: 4.3 g/dL (ref 3.8–4.9)
Alkaline Phosphatase: 91 IU/L (ref 44–121)
BUN/Creatinine Ratio: 16 (ref 9–23)
BUN: 13 mg/dL (ref 6–24)
Bilirubin Total: 0.3 mg/dL (ref 0.0–1.2)
CO2: 26 mmol/L (ref 20–29)
Calcium: 8.5 mg/dL — ABNORMAL LOW (ref 8.7–10.2)
Chloride: 103 mmol/L (ref 96–106)
Creatinine, Ser: 0.79 mg/dL (ref 0.57–1.00)
Globulin, Total: 2.6 g/dL (ref 1.5–4.5)
Glucose: 94 mg/dL (ref 70–99)
Potassium: 5.1 mmol/L (ref 3.5–5.2)
Sodium: 144 mmol/L (ref 134–144)
Total Protein: 6.9 g/dL (ref 6.0–8.5)
eGFR: 87 mL/min/{1.73_m2} (ref >59)

## 2022-09-19 LAB — HEPB+HEPC+HIV PANEL

## 2022-09-19 LAB — LIPID PANEL
Chol/HDL Ratio: 3.3 ratio (ref 0.0–4.4)
Cholesterol, Total: 242 mg/dL — ABNORMAL HIGH (ref 100–199)
HDL: 74 mg/dL (ref >39)
LDL Chol Calc (NIH): 145 mg/dL — ABNORMAL HIGH (ref 0–99)
Triglycerides: 130 mg/dL (ref 0–149)
VLDL Cholesterol Cal: 23 mg/dL (ref 5–40)

## 2022-09-19 LAB — CBC WITH DIFFERENTIAL/PLATELET
Basophils Absolute: 0.1 10*3/uL (ref 0.0–0.2)
Basos: 1 %
EOS (ABSOLUTE): 0.3 10*3/uL (ref 0.0–0.4)
Eos: 3 %
Hematocrit: 41.4 % (ref 34.0–46.6)
Hemoglobin: 13.8 g/dL (ref 11.1–15.9)
Immature Grans (Abs): 0 10*3/uL (ref 0.0–0.1)
Immature Granulocytes: 0 %
Lymphocytes Absolute: 2.2 10*3/uL (ref 0.7–3.1)
Lymphs: 25 %
MCH: 30.9 pg (ref 26.6–33.0)
MCHC: 33.3 g/dL (ref 31.5–35.7)
MCV: 93 fL (ref 79–97)
Monocytes Absolute: 0.7 10*3/uL (ref 0.1–0.9)
Monocytes: 8 %
Neutrophils Absolute: 5.5 10*3/uL (ref 1.4–7.0)
Neutrophils: 63 %
Platelets: 357 10*3/uL (ref 150–450)
RBC: 4.46 x10E6/uL (ref 3.77–5.28)
RDW: 11.5 % — ABNORMAL LOW (ref 11.7–15.4)
WBC: 8.7 10*3/uL (ref 3.4–10.8)

## 2022-09-19 LAB — BAYER DCA HB A1C WAIVED: HB A1C (BAYER DCA - WAIVED): 5.6 % (ref 4.8–5.6)

## 2022-09-19 LAB — THYROID PANEL WITH TSH: TSH: 2.46 u[IU]/mL (ref 0.450–4.500)

## 2022-09-19 MED ORDER — DULOXETINE HCL 60 MG PO CPEP: 60.0000 mg | ORAL_CAPSULE | Freq: Every day | ORAL | 5 refills | Status: DC

## 2022-09-19 MED ORDER — BUPROPION HCL ER (XL) 150 MG PO TB24: 150.0000 mg | ORAL_TABLET | Freq: Every day | ORAL | 5 refills | Status: DC

## 2022-09-19 NOTE — Progress Notes (Signed)
Complete physical exam  Patient: Virginia Black   DOB: Oct 21, 1965   57 y.o. Female  MRN: [id number]  Subjective:    Chief Complaint  Patient presents with   Establish Care   Annual Exam    Virginia Black is a 57 y.o. female who presents today for a complete physical exam. She reports consuming a general diet. The patient does not participate in regular exercise at present. She generally feels well. She reports sleeping well. She does not have additional problems to discuss today.   MDD/GAD Major Depressive Disorder (MDD) and Generalized Anxiety Disorder (GAD). They are currently on a regimen of Duloxetine and Wellbutrin. Reports adherence to this treatment plan,  not experiencing significant ongoing symptoms, including feelings of being overwhelmed and persistent racing thoughts. Denies SI/HI  Health maintenance up to date Mammogram ordered HIV/Hep-C ordered    07/06/2021    3:08 PM 06/22/2020    9:47 AM 01/29/2019    8:50 AM  PHQ9 SCORE ONLY  PHQ-9 Total Score 0 0 0    Most recent fall risk assessment:    09/19/2022   10:53 AM  Fall Risk   Falls in the past year? 0  Number falls in past yr: 0  Injury with Fall? 0  Risk for fall due to : No Fall Risks  Follow up Falls evaluation completed;Education provided;Falls prevention discussed     Most recent depression screenings:    07/06/2021    3:08 PM 06/22/2020    9:47 AM  PHQ 2/9 Scores  PHQ - 2 Score 0 0  PHQ- 9 Score 0 0    Vision:Within last year and in Feb 2024  Patient Active Problem List   Diagnosis Date Noted   Screening mammogram for breast cancer 09/19/2022   Routine medical exam 09/19/2022   Obesity (BMI 30.0-34.9) 07/06/2021   Essential tremor 06/26/2020   Depression, recurrent (HCC) 06/22/2020   Seasonal allergies 01/29/2019   Sleep apnea    Anxiety    Past Medical History:  Diagnosis Date   Allergy    Anxiety    Sleep apnea    uses CPAP    Past Surgical History:  Procedure Laterality Date    CYST EXCISION  2009   chest    DILATION AND EVACUATION  2002   Social History   Tobacco Use   Smoking status: Former    Current packs/day: 0.00    Types: Cigarettes    Quit date: 02/13/2000    Years since quitting: 22.6   Smokeless tobacco: Never  Vaping Use   Vaping status: Never Used  Substance Use Topics   Alcohol use: Yes    Alcohol/week: 7.0 standard drinks of alcohol    Types: 7 Glasses of wine per week    Comment: red wine every day  per pt    Drug use: No   Social History   Socioeconomic History   Marital status: Married    Spouse name: Not on file   Number of children: Not on file   Years of education: Not on file   Highest education level: Not on file  Occupational History   Not on file  Tobacco Use   Smoking status: Former    Current packs/day: 0.00    Types: Cigarettes    Quit date: 02/13/2000    Years since quitting: 22.6   Smokeless tobacco: Never  Vaping Use   Vaping status: Never Used  Substance and Sexual Activity   Alcohol use: Yes  Alcohol/week: 7.0 standard drinks of alcohol    Types: 7 Glasses of wine per week    Comment: red wine every day  per pt    Drug use: No   Sexual activity: Yes  Other Topics Concern   Not on file  Social History Narrative   Not on file   Social Determinants of Health   Financial Resource Strain: Not on file  Food Insecurity: Not on file  Transportation Needs: Not on file  Physical Activity: Not on file  Stress: Not on file  Social Connections: Not on file  Intimate Partner Violence: Not on file   Family Status  Relation Name Status   Mother  Alive   Father  Alive   Mat Aunt  (Not Specified)   MGM  (Not Specified)   PGF  (Not Specified)   Neg Hx  (Not Specified)  No partnership data on file   Family History  Problem Relation Age of Onset   Hyperlipidemia Mother    High blood pressure Mother    Hyperlipidemia Father    High blood pressure Father    Diabetes Maternal Aunt    Stroke Maternal  Grandmother    Emphysema Paternal Grandfather    Colon cancer Neg Hx    Esophageal cancer Neg Hx    Stomach cancer Neg Hx    Breast cancer Neg Hx    Allergies  Allergen Reactions   Erythromycin Nausea And Vomiting      Patient Care Team: Olivia Mackie, MD as Consulting Physician (Obstetrics and Gynecology)   Outpatient Medications Prior to Visit  Medication Sig   loratadine (CLARITIN) 10 MG tablet Take by mouth.   Multiple Vitamin (THERA) TABS Take by mouth.   Omega-3 Fatty Acids (FISH OIL) 1000 MG CAPS Take by mouth.   Turmeric 500 MG CAPS Take 1 capsule by mouth daily.   [DISCONTINUED] buPROPion (WELLBUTRIN XL) 150 MG 24 hr tablet Take 1 tablet (150 mg total) by mouth daily.   [DISCONTINUED] DULoxetine (CYMBALTA) 60 MG capsule Take 1 capsule (60 mg total) by mouth daily.   No facility-administered medications prior to visit.    Review of Systems  Constitutional:  Negative for chills, fever and malaise/fatigue.  Eyes:  Negative for blurred vision and pain.  Respiratory:  Negative for cough and shortness of breath.   Cardiovascular:  Negative for chest pain and leg swelling.  Gastrointestinal:  Negative for blood in stool, melena, nausea and vomiting.  Genitourinary:  Negative for dysuria and hematuria.  Musculoskeletal:  Negative for falls and myalgias.  Skin:  Negative for itching and rash.  Neurological:  Negative for dizziness and headaches.  Psychiatric/Behavioral:  Negative for depression, substance abuse and suicidal ideas. The patient is not nervous/anxious and does not have insomnia.    Negative unless indicated in HPI    Objective:     BP 118/74   Pulse 76   Temp 97.6 F (36.4 C) (Temporal)   Ht 5\' 8"  (1.727 m)   Wt 203 lb 9.6 oz (92.4 kg)   SpO2 94%   BMI 30.96 kg/m  BP Readings from Last 3 Encounters:  09/19/22 118/74  07/06/21 135/76  06/22/20 119/73   Wt Readings from Last 3 Encounters:  09/19/22 203 lb 9.6 oz (92.4 kg)  07/06/21 198 lb 3.2  oz (89.9 kg)  06/22/20 204 lb 9.6 oz (92.8 kg)      Physical Exam Vitals and nursing note reviewed.  Constitutional:      Appearance: Normal  appearance.  HENT:     Head: Normocephalic and atraumatic.  Eyes:     General: No scleral icterus.    Extraocular Movements: Extraocular movements intact.     Conjunctiva/sclera: Conjunctivae normal.     Pupils: Pupils are equal, round, and reactive to light.  Neck:     Vascular: No carotid bruit.  Cardiovascular:     Rate and Rhythm: Normal rate and regular rhythm.  Pulmonary:     Effort: Pulmonary effort is normal.     Breath sounds: Normal breath sounds. No wheezing.  Musculoskeletal:        General: Normal range of motion.     Cervical back: Normal range of motion and neck supple. No rigidity or tenderness.     Right lower leg: No edema.     Left lower leg: No edema.  Lymphadenopathy:     Cervical: No cervical adenopathy.  Skin:    General: Skin is warm and dry.     Coloration: Skin is not jaundiced.     Findings: No rash.  Neurological:     Mental Status: She is alert and oriented to person, place, and time. Mental status is at baseline.  Psychiatric:        Attention and Perception: Attention and perception normal.        Mood and Affect: Mood normal.        Speech: Speech normal.        Behavior: Behavior normal.        Thought Content: Thought content normal. Thought content does not include homicidal or suicidal ideation. Thought content does not include homicidal or suicidal plan.        Cognition and Memory: Cognition and memory normal.        Judgment: Judgment normal.      Results for orders placed or performed in visit on 09/19/22  Bayer DCA Hb A1c Waived  Result Value Ref Range   HB A1C (BAYER DCA - WAIVED) 5.6 4.8 - 5.6 %   Last CBC Lab Results  Component Value Date   WBC 10.2 07/06/2021   HGB 13.4 07/06/2021   HCT 40.1 07/06/2021   MCV 92 07/06/2021   MCH 30.7 07/06/2021   RDW 12.1 07/06/2021   PLT  345 07/06/2021   Last metabolic panel Lab Results  Component Value Date   GLUCOSE 90 07/06/2021   NA 144 07/06/2021   K 4.4 07/06/2021   CL 103 07/06/2021   CO2 27 07/06/2021   BUN 18 07/06/2021   CREATININE 0.74 07/06/2021   EGFR 95 07/06/2021   CALCIUM 9.0 07/06/2021   PROT 6.8 07/06/2021   ALBUMIN 4.2 07/06/2021   LABGLOB 2.6 07/06/2021   AGRATIO 1.6 07/06/2021   BILITOT 0.4 07/06/2021   ALKPHOS 87 07/06/2021   AST 18 07/06/2021   ALT 20 07/06/2021   Last lipids Lab Results  Component Value Date   CHOL 240 (H) 07/06/2021   HDL 77 07/06/2021   LDLCALC 140 (H) 07/06/2021   TRIG 133 07/06/2021   CHOLHDL 3.1 07/06/2021   Last thyroid functions Lab Results  Component Value Date   TSH 3.380 06/22/2020        Assessment & Plan:    Routine Health Maintenance and Physical Exam  Discussed health benefits of physical activity, and encouraged her to engage in regular exercise appropriate for her age and condition.  Routine medical exam -     CBC with Differential/Platelet -     CMP14+EGFR -  Lipid panel -     Thyroid Panel With TSH -     HepB+HepC+HIV Panel -     Bayer DCA Hb A1c Waived  Screening mammogram for breast cancer -     MM 3D DIAGNOSTIC MAMMOGRAM BILATERAL BREAST  Anxiety -     Thyroid Panel With TSH -     buPROPion HCl ER (XL); Take 1 tablet (150 mg total) by mouth daily.  Dispense: 30 tablet; Refill: 5 -     DULoxetine HCl; Take 1 capsule (60 mg total) by mouth daily.  Dispense: 30 capsule; Refill: 5  Depression, recurrent (HCC) -     Thyroid Panel With TSH -     buPROPion HCl ER (XL); Take 1 tablet (150 mg total) by mouth daily.  Dispense: 30 tablet; Refill: 5 -     DULoxetine HCl; Take 1 capsule (60 mg total) by mouth daily.  Dispense: 30 capsule; Refill: 5    Kesia was seen today for a physical MDD/GAD well managed, continue Cymbalta 60 mg  and Wellbutrin XL 150 mg Labs: Cbc, CMP, Lipid, TSH, A1c Mammogram order Up to date in all  maintenance health   Continue healthy lifestyle choices, including diet (rich in fruits, vegetables, and lean proteins, and low in salt and simple carbohydrates) and exercise (at least 30 minutes of moderate physical activity daily).     The above assessment and management plan was discussed with the patient. The patient verbalized understanding of and has agreed to the management plan. Patient is aware to call the clinic if they develop any new symptoms or if symptoms persist or worsen. Patient is aware when to return to the clinic for a follow-up visit. Patient educated on when it is appropriate to go to the emergency department.  Return in about 6 weeks (around 10/31/2022) for follow-up MDD.     Arrie Aran Santa Lighter, DNP [facility] [address] [place], [place] [address] [phone]

## 2022-09-20 ENCOUNTER — Other Ambulatory Visit: Payer: Self-pay

## 2022-09-20 ENCOUNTER — Other Ambulatory Visit: Payer: Self-pay | Admitting: Nurse Practitioner

## 2022-09-20 DIAGNOSIS — Z1231 Encounter for screening mammogram for malignant neoplasm of breast: Secondary | ICD-10-CM

## 2022-09-20 DIAGNOSIS — E785 Hyperlipidemia, unspecified: Secondary | ICD-10-CM

## 2022-09-20 MED ORDER — ATORVASTATIN CALCIUM 10 MG PO TABS: 10.0000 mg | ORAL_TABLET | Freq: Every day | ORAL | 5 refills | Status: DC

## 2022-09-24 ENCOUNTER — Telehealth: Payer: Self-pay | Admitting: Nurse Practitioner

## 2022-09-24 NOTE — Telephone Encounter (Signed)
Patient would like to speak with Dois Davenport about the statin medication that was called in for her.  She has some questions and would really appreciate it if she could help her with these.  Please call

## 2022-09-26 ENCOUNTER — Encounter: Payer: 59 | Admitting: Nurse Practitioner

## 2022-10-10 ENCOUNTER — Ambulatory Visit
Admission: RE | Admit: 2022-10-10 | Discharge: 2022-10-10 | Disposition: A | Discharge status: Home / Self Care | Payer: 59 | Source: Ambulatory Visit | Attending: Nurse Practitioner | Admitting: Nurse Practitioner

## 2022-10-10 DIAGNOSIS — Z1231 Encounter for screening mammogram for malignant neoplasm of breast: Secondary | ICD-10-CM

## 2022-12-05 ENCOUNTER — Telehealth: Payer: Self-pay | Admitting: Nurse Practitioner

## 2022-12-05 NOTE — Telephone Encounter (Signed)
SPOKE WITH PATIENT AND ADVISED THAT THE RECALL WAS ONLY A CERTAIN LOT NUMBER AND DOSE WHICH IS NOT HER DOSE AND IF IT WAS HER PHARMACY WOULD REACH OUT TO [place] AND HER. PATIENT VOICES UNDERSTANDING

## 2022-12-05 NOTE — Telephone Encounter (Signed)
Calling back to check on status of message left this morning.

## 2023-03-11 ENCOUNTER — Other Ambulatory Visit: Payer: Self-pay | Admitting: Nurse Practitioner

## 2023-03-11 DIAGNOSIS — F339 Major depressive disorder, recurrent, unspecified: Secondary | ICD-10-CM

## 2023-03-11 DIAGNOSIS — F419 Anxiety disorder, unspecified: Secondary | ICD-10-CM

## 2023-03-27 ENCOUNTER — Encounter: Payer: Self-pay | Admitting: Nurse Practitioner

## 2023-03-27 ENCOUNTER — Ambulatory Visit: Payer: 59 | Admitting: Nurse Practitioner

## 2023-03-27 VITALS — BP 119/72 | HR 79 | Temp 97.0°F | Ht 68.0 in | Wt 188.8 lb

## 2023-03-27 DIAGNOSIS — F419 Anxiety disorder, unspecified: Secondary | ICD-10-CM | POA: Diagnosis not present

## 2023-03-27 DIAGNOSIS — F339 Major depressive disorder, recurrent, unspecified: Secondary | ICD-10-CM | POA: Diagnosis not present

## 2023-03-27 DIAGNOSIS — E785 Hyperlipidemia, unspecified: Secondary | ICD-10-CM | POA: Diagnosis not present

## 2023-03-27 DIAGNOSIS — Z23 Encounter for immunization: Secondary | ICD-10-CM

## 2023-03-27 LAB — LIPID PANEL
Chol/HDL Ratio: 3.1 {ratio} (ref 0.0–4.4)
Cholesterol, Total: 240 mg/dL — ABNORMAL HIGH (ref 100–199)
HDL: 78 mg/dL (ref >39)
LDL Chol Calc (NIH): 140 mg/dL — ABNORMAL HIGH (ref 0–99)
Triglycerides: 129 mg/dL (ref 0–149)
VLDL Cholesterol Cal: 22 mg/dL (ref 5–40)

## 2023-03-27 NOTE — Progress Notes (Signed)
Established Patient Office Visit  Subjective  Patient ID: Virginia Black, female    DOB: [birth year]  Age: 58 y.o. MRN: [id number]  Chief Complaint  Patient presents with   Medical Management of Chronic Issues    HPI Virginia Black is a 58 year old female here today for follow-up visit for chronic disease management Hyperlipidemia: She was diagnosed with hyperlipidemia based on the labs September 19, 2022 and prescribed Lipitor 10 mg daily.  Client did not want to take Lipitor when wanted to try lifestyle modification his today to have lipids rechecked.  She lost 15 pounds since her last appointment  " I am wearing clothes that is 1 my son was in middle school" she is following a Mediterranean diet and reports that she really enjoyed.  She is also taking over-the-counter fish oil, denies chest pain, syncope, fever, chills.  Depression, Follow-up  She  was last seen for this 6 months ago. Changes made at last visit include Wellbutrin 150 mg and Cymbalta 60 mg.   She reports excellent compliance with treatment. She is not having side effects.   She reports excellent tolerance of treatment. Current symptoms include: None Last She feels she is Improved since last visit. Plan to continue current therapy  Flowsheet Row Office Visit from 07/06/2021 in [facility]  PHQ-9 Total Score 0          07/06/2021    3:08 PM 06/22/2020    9:47 AM 01/29/2019    8:50 AM  GAD 7 : Generalized Anxiety Score  Nervous, Anxious, on Edge 0 1 1  Control/stop worrying 0 0 0  Worry too much - different things 0 0 1  Trouble relaxing 0 1 0  Restless 0 0 0  Easily annoyed or irritable 0 1 1  Afraid - awful might happen 0 0 0  Total GAD 7 Score 0 3 3  Anxiety Difficulty Not difficult at all Not difficult at all Not difficult at all    Anxiety, Follow-up  She was last seen for anxiety 6 months ago. Changes made at last visit include Cymbalta 60 mg daily.   She reports  excellent compliance with treatment. She reports excellent tolerance of treatment. She is not having side effects.   She feels her anxiety is mild and Improved since last visit.  Symptoms: No chest pain No difficulty concentrating  No dizziness No fatigue  No feelings of losing control No insomnia  No irritable No palpitations  No panic attacks No racing thoughts  No shortness of breath No sweating  No tremors/shakes    GAD-7 Results    07/06/2021    3:08 PM 06/22/2020    9:47 AM 01/29/2019    8:50 AM  GAD-7 Generalized Anxiety Disorder Screening Tool  1. Feeling Nervous, Anxious, or on Edge 0 1 1  2. Not Being Able to Stop or Control Worrying 0 0 0  3. Worrying Too Much About Different Things 0 0 1  4. Trouble Relaxing 0 1 0  5. Being So Restless it's Hard To Sit Still 0 0 0  6. Becoming Easily Annoyed or Irritable 0 1 1  7. Feeling Afraid As If Something Awful Might Happen 0 0 0  Total GAD-7 Score 0 3 3  Difficulty At Work, Home, or Getting  Along With Others? Not difficult at all Not difficult at all Not difficult at all    PHQ-9 Scores    03/27/2023    8:59 AM  07/06/2021    3:08 PM 06/22/2020    9:47 AM  PHQ9 SCORE ONLY  PHQ-9 Total Score 0 0 0       Health Maintenance: Flu Vacc administered  Patient Active Problem List   Diagnosis Date Noted   Hyperlipidemia LDL goal <100 03/27/2023   Screening mammogram for breast cancer 09/19/2022   Routine medical exam 09/19/2022   Obesity (BMI 30.0-34.9) 07/06/2021   Essential tremor 06/26/2020   Depression, recurrent (HCC) 06/22/2020   Seasonal allergies 01/29/2019   Sleep apnea    Anxiety    Past Medical History:  Diagnosis Date   Allergy    Anxiety    Sleep apnea    uses CPAP    Past Surgical History:  Procedure Laterality Date   CYST EXCISION  2009   chest    DILATION AND EVACUATION  2002   Social History   Tobacco Use   Smoking status: Former    Current packs/day: 0.00    Types: Cigarettes     Quit date: 02/13/2000    Years since quitting: 23.1   Smokeless tobacco: Never  Vaping Use   Vaping status: Never Used  Substance Use Topics   Alcohol use: Yes    Alcohol/week: 7.0 standard drinks of alcohol    Types: 7 Glasses of wine per week    Comment: red wine every day  per pt    Drug use: No   Social History   Socioeconomic History   Marital status: Married    Spouse name: Not on file   Number of children: Not on file   Years of education: Not on file   Highest education level: Some college, no degree  Occupational History   Not on file  Tobacco Use   Smoking status: Former    Current packs/day: 0.00    Types: Cigarettes    Quit date: 02/13/2000    Years since quitting: 23.1   Smokeless tobacco: Never  Vaping Use   Vaping status: Never Used  Substance and Sexual Activity   Alcohol use: Yes    Alcohol/week: 7.0 standard drinks of alcohol    Types: 7 Glasses of wine per week    Comment: red wine every day  per pt    Drug use: No   Sexual activity: Yes  Other Topics Concern   Not on file  Social History Narrative   Not on file   Social Drivers of Health   Financial Resource Strain: Low Risk  (03/25/2023)   Overall Financial Resource Strain (CARDIA)    Difficulty of Paying Living Expenses: Not very hard  Food Insecurity: No Food Insecurity (03/25/2023)   Hunger Vital Sign    Worried About Running Out of Food in the Last Year: Never true    Ran Out of Food in the Last Year: Never true  Transportation Needs: No Transportation Needs (03/25/2023)   PRAPARE - Administrator, Civil Service (Medical): No    Lack of Transportation (Non-Medical): No  Physical Activity: Unknown (03/25/2023)   Exercise Vital Sign    Days of Exercise per Week: 0 days    Minutes of Exercise per Session: Not on file  Stress: No Stress Concern Present (03/25/2023)   [employer] of Occupational Health - Occupational Stress Questionnaire    Feeling of Stress : Not at all   Social Connections: Unknown (03/25/2023)   Social Connection and Isolation Panel [NHANES]    Frequency of Communication with Friends and Family: Once a  week    Frequency of Social Gatherings with Friends and Family: Patient declined    Attends Religious Services: 1 to 4 times per year    Active Member of [employer] or Organizations: Yes    Attends Engineer, structural: 1 to 4 times per year    Marital Status: Married  Catering manager Violence: Not on file   Family Status  Relation Name Status   Mother  Alive   Father  Alive   Mat Aunt  (Not Specified)   MGM  (Not Specified)   PGF  (Not Specified)   Neg Hx  (Not Specified)  No partnership data on file   Family History  Problem Relation Age of Onset   Hyperlipidemia Mother    High blood pressure Mother    Hyperlipidemia Father    High blood pressure Father    Diabetes Maternal Aunt    Stroke Maternal Grandmother    Emphysema Paternal Grandfather    Colon cancer Neg Hx    Esophageal cancer Neg Hx    Stomach cancer Neg Hx    Breast cancer Neg Hx    Allergies  Allergen Reactions   Erythromycin Nausea And Vomiting      Review of Systems  Constitutional:  Negative for chills and fever.  HENT:  Negative for ear pain and sore throat.   Respiratory:  Negative for cough and shortness of breath.   Cardiovascular:  Negative for chest pain and leg swelling.  Gastrointestinal:  Negative for constipation, nausea and vomiting.  Genitourinary:  Negative for frequency and hematuria.  Skin:  Negative for itching and rash.  Neurological:  Negative for dizziness and headaches.  Endo/Heme/Allergies:  Negative for polydipsia.  Psychiatric/Behavioral:  Negative for depression and suicidal ideas. The patient does not have insomnia.    Negative unless indicated in HPI   Objective:     BP 119/72   Pulse 79   Temp (!) 97 F (36.1 C)   Ht 5\' 8"  (1.727 m)   Wt 188 lb 12.8 oz (85.6 kg)   SpO2 97%   BMI 28.71 kg/m  BP Readings  from Last 3 Encounters:  03/27/23 119/72  09/19/22 118/74  07/06/21 135/76   Wt Readings from Last 3 Encounters:  03/27/23 188 lb 12.8 oz (85.6 kg)  09/19/22 203 lb 9.6 oz (92.4 kg)  07/06/21 198 lb 3.2 oz (89.9 kg)      Physical Exam Vitals reviewed.  Constitutional:      General: She is not in acute distress. HENT:     Head: Normocephalic and atraumatic.     Right Ear: Tympanic membrane, ear canal and external ear normal. There is no impacted cerumen.     Left Ear: Tympanic membrane, ear canal and external ear normal. There is no impacted cerumen.     Nose: Nose normal.     Mouth/Throat:     Mouth: Mucous membranes are moist.  Eyes:     General: No scleral icterus.    Extraocular Movements: Extraocular movements intact.     Conjunctiva/sclera: Conjunctivae normal.     Pupils: Pupils are equal, round, and reactive to light.  Cardiovascular:     Rate and Rhythm: Normal rate and regular rhythm.  Pulmonary:     Effort: Pulmonary effort is normal.     Breath sounds: Normal breath sounds.  Abdominal:     General: Bowel sounds are normal.     Palpations: Abdomen is soft.  Musculoskeletal:  General: Normal range of motion.     Right lower leg: No edema.     Left lower leg: No edema.  Skin:    General: Skin is warm and dry.     Findings: No rash.  Neurological:     Mental Status: She is alert and oriented to person, place, and time.  Psychiatric:        Mood and Affect: Mood normal.        Behavior: Behavior normal.        Thought Content: Thought content normal.        Judgment: Judgment normal.      No results found for any visits on 03/27/23.  Last CBC Lab Results  Component Value Date   WBC 8.7 09/19/2022   HGB 13.8 09/19/2022   HCT 41.4 09/19/2022   MCV 93 09/19/2022   MCH 30.9 09/19/2022   RDW 11.5 (L) 09/19/2022   PLT 357 09/19/2022   Last metabolic panel Lab Results  Component Value Date   GLUCOSE 94 09/19/2022   NA 144 09/19/2022   K 5.1  09/19/2022   CL 103 09/19/2022   CO2 26 09/19/2022   BUN 13 09/19/2022   CREATININE 0.79 09/19/2022   EGFR 87 09/19/2022   CALCIUM 8.5 (L) 09/19/2022   PROT 6.9 09/19/2022   ALBUMIN 4.3 09/19/2022   LABGLOB 2.6 09/19/2022   AGRATIO 1.6 07/06/2021   BILITOT 0.3 09/19/2022   ALKPHOS 91 09/19/2022   AST 22 09/19/2022   ALT 22 09/19/2022   Last lipids Lab Results  Component Value Date   CHOL 242 (H) 09/19/2022   HDL 74 09/19/2022   LDLCALC 145 (H) 09/19/2022   TRIG 130 09/19/2022   CHOLHDL 3.3 09/19/2022   Last hemoglobin A1c Lab Results  Component Value Date   HGBA1C 5.6 09/19/2022   Last thyroid functions Lab Results  Component Value Date   TSH 2.460 09/19/2022   T4TOTAL 7.4 09/19/2022        Assessment & Plan:  Hyperlipidemia LDL goal <100 -     Lipid panel  Anxiety  Depression, recurrent (HCC)  Monteen is a 58 year old Caucasian female seen today for chronic disease management, no acute distress Hyperlipidemia: Recheck lipid today advised client to continue on current diet and fish oil daily Depression continue Wellbutrin 150 no refills needed today Anxiety continue duloxetine 60 mg daily no refills needed today Flu vaccine administered today Continue healthy lifestyle choices, including diet (rich in fruits, vegetables, and lean proteins, and low in salt and simple carbohydrates) and exercise (at least 30 minutes of moderate physical activity daily).     The above assessment and management plan was discussed with the patient. The patient verbalized understanding of and has agreed to the management plan. Patient is aware to call the clinic if they develop any new symptoms or if symptoms persist or worsen. Patient is aware when to return to the clinic for a follow-up visit. Patient educated on when it is appropriate to go to the emergency department.  Return for as already scheduled.    Arrie Aran Santa Lighter, [place] [facility] [address] [place], [place] [address] [phone]    Note: This document was prepared by [employer] voice dictation technology and any errors that results from this process are unintentional.

## 2023-03-27 NOTE — Addendum Note (Signed)
Addended by: Adella Hare B on: 03/27/2023 02:05 PM   Modules accepted: Orders

## 2023-04-09 ENCOUNTER — Other Ambulatory Visit: Payer: Self-pay | Admitting: Nurse Practitioner

## 2023-04-09 DIAGNOSIS — F419 Anxiety disorder, unspecified: Secondary | ICD-10-CM

## 2023-04-09 DIAGNOSIS — F339 Major depressive disorder, recurrent, unspecified: Secondary | ICD-10-CM

## 2023-09-23 NOTE — Progress Notes (Deleted)
   Complete physical exam  Patient: Virginia Black   DOB: [birth year]   58 y.o. Female  MRN: [id number]  Subjective:    No chief complaint on file.   Virginia Black is a 58 y.o. female who presents today for a complete physical exam. She reports consuming a {diet types:17450} diet. {types:19826} She generally feels {DESC; WELL/FAIRLY WELL/POORLY:18703}. She reports sleeping {DESC; WELL/FAIRLY WELL/POORLY:18703}. She {does/does not:200015} have additional problems to discuss today.    Most recent fall risk assessment:    03/27/2023    8:59 AM  Fall Risk   Falls in the past year? 0     Most recent depression screenings:    03/27/2023    8:59 AM 07/06/2021    3:08 PM  PHQ 2/9 Scores  PHQ - 2 Score 0 0  PHQ- 9 Score  0    {VISON DENTAL STD PSA (Optional):27386}  {History (Optional):23778}  Patient Care Team: Deitra Morton Hummer, Nena, NP as PCP - General (Nurse Practitioner) Gorge Ade, MD as Consulting Physician (Obstetrics and Gynecology)   Outpatient Medications Prior to Visit  Medication Sig   buPROPion  (WELLBUTRIN  XL) 150 MG 24 hr tablet Take 1 tablet by mouth once daily   DULoxetine  (CYMBALTA ) 60 MG capsule Take 1 capsule by mouth once daily   loratadine (CLARITIN) 10 MG tablet Take by mouth.   Multiple Vitamin (THERA) TABS Take by mouth.   Omega-3 Fatty Acids (FISH OIL) 1000 MG CAPS Take by mouth.   Turmeric 500 MG CAPS Take 1 capsule by mouth daily.   No facility-administered medications prior to visit.    ROS Negative unless indicated in HPI    Objective:     There were no vitals taken for this visit. {Vitals History (Optional):23777}  Physical Exam   No results found for any visits on 09/25/23. {Show previous labs (optional):23779}    Assessment & Plan:    Routine Health Maintenance and Physical Exam  Discussed health benefits of physical activity, and encouraged her to engage in regular exercise appropriate for her age and condition.  There are  no diagnoses linked to this encounter.  No follow-ups on file.     @Hailey Miles  Hum[place], [place]

## 2023-09-25 ENCOUNTER — Encounter: Payer: 59 | Admitting: Nurse Practitioner

## 2023-10-02 ENCOUNTER — Other Ambulatory Visit: Payer: Self-pay | Admitting: Nurse Practitioner

## 2023-10-02 ENCOUNTER — Encounter: Payer: Self-pay | Admitting: Nurse Practitioner

## 2023-10-02 ENCOUNTER — Telehealth (INDEPENDENT_AMBULATORY_CARE_PROVIDER_SITE_OTHER): Payer: Self-pay | Admitting: Nurse Practitioner

## 2023-10-02 DIAGNOSIS — F339 Major depressive disorder, recurrent, unspecified: Secondary | ICD-10-CM

## 2023-10-02 DIAGNOSIS — F419 Anxiety disorder, unspecified: Secondary | ICD-10-CM | POA: Diagnosis not present

## 2023-10-02 DIAGNOSIS — Z1231 Encounter for screening mammogram for malignant neoplasm of breast: Secondary | ICD-10-CM

## 2023-10-02 MED ORDER — DULOXETINE HCL 60 MG PO CPEP: 60.0000 mg | ORAL_CAPSULE | Freq: Every day | ORAL | 0 refills | Status: DC

## 2023-10-02 MED ORDER — BUPROPION HCL ER (XL) 150 MG PO TB24: 150.0000 mg | ORAL_TABLET | Freq: Every day | ORAL | 0 refills | Status: DC

## 2023-10-02 NOTE — Progress Notes (Signed)
 Virtual Visit via video Note Due to COVID-19 pandemic this visit was conducted virtually. This visit type was conducted due to national recommendations for restrictions regarding the COVID-19 Pandemic (e.g. social distancing, sheltering in place) in an effort to limit this patient's exposure and mitigate transmission in our community. All issues noted in this document were discussed and addressed.  A physical exam was not performed with this format.   I connected with Virginia Black on 10/02/2023 at 1220 by Name and DOB and verified that I am speaking with the correct person using two identifiers. Virginia Black is currently located at home  during visit. The provider, Nena Deitra Morton Sebastian, DNP is located in their office at time of visit.  I discussed the limitations, risks, security and privacy concerns of performing an evaluation and management service by virtual visit and the availability of in person appointments. I also discussed with the patient that there may be a patient responsible charge related to this service. The patient expressed understanding and agreed to proceed.  Subjective:  Patient ID: Virginia Black, female    DOB: 1966-02-03, 58 y.o.   MRN: [id number]  Chief Complaint:  Depression ( Concerns about changes in appointment time and date, might ran out of med prior to her appointment)   HPI: Virginia Black is a 58 y.o. female presenting on 10/02/2023 for Depression ( Concerns about changes in appointment time and date, might ran out of med prior to her appointment)  Patient was last seen on 03/28/2023 for management of major depressive disorder (MDD), anxiety, and allergic rhinitis. She had to reschedule her August appointment to November due to a work conflict and expressed concern that her medications might run out before her next visit. She requests assurance that all medications will be refilled as needed. The patient reports doing well on her current medications with no side  effects.   Relevant past medical, surgical, family, and social history reviewed and updated as indicated.  Allergies and medications reviewed and updated.   Past Medical History:  Diagnosis Date   Allergy    Anxiety    Sleep apnea    uses CPAP     Past Surgical History:  Procedure Laterality Date   CYST EXCISION  2009   chest    DILATION AND EVACUATION  2002    Social History   Socioeconomic History   Marital status: Married    Spouse name: Not on file   Number of children: Not on file   Years of education: Not on file   Highest education level: Some college, no degree  Occupational History   Not on file  Tobacco Use   Smoking status: Former    Current packs/day: 0.00    Types: Cigarettes    Quit date: 02/13/2000    Years since quitting: 23.6   Smokeless tobacco: Never  Vaping Use   Vaping status: Never Used  Substance and Sexual Activity   Alcohol use: Yes    Alcohol/week: 7.0 standard drinks of alcohol    Types: 7 Glasses of wine per week    Comment: red wine every day  per pt    Drug use: No   Sexual activity: Yes  Other Topics Concern   Not on file  Social History Narrative   Not on file   Social Drivers of Health   Financial Resource Strain: Low Risk  (09/30/2023)   Overall Financial Resource Strain (CARDIA)    Difficulty of Paying Living  Expenses: Not hard at all  Food Insecurity: No Food Insecurity (09/30/2023)   Hunger Vital Sign    Worried About Running Out of Food in the Last Year: Never true    Ran Out of Food in the Last Year: Never true  Transportation Needs: No Transportation Needs (09/30/2023)   PRAPARE - Administrator, Civil Service (Medical): No    Lack of Transportation (Non-Medical): No  Physical Activity: Inactive (09/30/2023)   Exercise Vital Sign    Days of Exercise per Week: 0 days    Minutes of Exercise per Session: Not on file  Stress: No Stress Concern Present (09/30/2023)   [employer] of Occupational Health  - Occupational Stress Questionnaire    Feeling of Stress: Not at all  Social Connections: Socially Integrated (09/30/2023)   Social Connection and Isolation Panel    Frequency of Communication with Friends and Family: Three times a week    Frequency of Social Gatherings with Friends and Family: Once a week    Attends Religious Services: 1 to 4 times per year    Active Member of [employer] or Organizations: Yes    Attends Banker Meetings: 1 to 4 times per year    Marital Status: Married  Catering manager Violence: Not on file    Outpatient Encounter Medications as of 10/02/2023  Medication Sig   buPROPion  (WELLBUTRIN  XL) 150 MG 24 hr tablet Take 1 tablet (150 mg total) by mouth daily.   DULoxetine  (CYMBALTA ) 60 MG capsule Take 1 capsule (60 mg total) by mouth daily.   loratadine (CLARITIN) 10 MG tablet Take by mouth.   Multiple Vitamin (THERA) TABS Take by mouth.   Omega-3 Fatty Acids (FISH OIL) 1000 MG CAPS Take by mouth.   Turmeric 500 MG CAPS Take 1 capsule by mouth daily.   [DISCONTINUED] buPROPion  (WELLBUTRIN  XL) 150 MG 24 hr tablet Take 1 tablet by mouth once daily   [DISCONTINUED] DULoxetine  (CYMBALTA ) 60 MG capsule Take 1 capsule by mouth once daily   No facility-administered encounter medications on file as of 10/02/2023.    Allergies  Allergen Reactions   Erythromycin Nausea And Vomiting    Review of Systems  Constitutional:  Negative for activity change, appetite change, fatigue and fever.  HENT:  Negative for congestion and sinus pressure.   Respiratory:  Negative for apnea, chest tightness and wheezing.   Cardiovascular:  Negative for chest pain and palpitations.  Gastrointestinal:  Negative for abdominal pain, blood in stool, diarrhea and nausea.  Skin:  Negative for color change, pallor and rash.  Neurological:  Negative for dizziness and headaches.  Psychiatric/Behavioral:  Negative for suicidal ideas. The patient is not nervous/anxious.      Observations/Objective: No vital signs or physical exam, this was a virtual health encounter.  Pt alert and oriented, answers all questions appropriately, and able to speak in full sentences.   Physical Exam Psychiatric:        Attention and Perception: Attention and perception normal.        Mood and Affect: Mood and affect normal.        Speech: Speech normal.        Behavior: Behavior normal. Behavior is cooperative.        Thought Content: Thought content normal. Thought content does not include homicidal or suicidal ideation. Thought content does not include homicidal or suicidal plan.        Cognition and Memory: Cognition and memory normal.  Judgment: Judgment normal.     Assessment and Plan: Virginia Black was seen today for depression.  Diagnoses and all orders for this visit:  Anxiety -     DULoxetine  (CYMBALTA ) 60 MG capsule; Take 1 capsule (60 mg total) by mouth daily. -     buPROPion  (WELLBUTRIN  XL) 150 MG 24 hr tablet; Take 1 tablet (150 mg total) by mouth daily.  Depression, recurrent (HCC) -     DULoxetine  (CYMBALTA ) 60 MG capsule; Take 1 capsule (60 mg total) by mouth daily. -     buPROPion  (WELLBUTRIN  XL) 150 MG 24 hr tablet; Take 1 tablet (150 mg total) by mouth daily.  Depression, recurrent (HCC) -     DULoxetine  (CYMBALTA ) 60 MG capsule; Take 1 capsule (60 mg total) by mouth daily. -     buPROPion  (WELLBUTRIN  XL) 150 MG 24 hr tablet; Take 1 tablet (150 mg total) by mouth daily.   Denies a 58 year old Caucasian female seen today via telehealth for medication refills, no acute distress Depression: Continue Cymbalta  60 and Wellbutrin  150 mg  refill provided  Anxiety: Continue Cymbalta  60 mg daily refill provided  The above assessment and management plan was discussed with the patient. The patient verbalized understanding of and has agreed to the management plan. Patient is aware to call the clinic if they develop any new symptoms or if symptoms persist or  worsen. Patient is aware when to return to the clinic for a follow-up visit. Patient educated on when it is appropriate to go to the emergency department.  Follow Up Instructions: Return for follow-up as already scheduled in Nov.    I discussed the assessment and treatment plan with the patient. The patient was provided an opportunity to ask questions and all were answered. The patient agreed with the plan and demonstrated an understanding of the instructions.   The patient was advised to call back or seek an in-person evaluation if the symptoms worsen or if the condition fails to improve as anticipated.  The above assessment and management plan was discussed with the patient. The patient verbalized understanding of and has agreed to the management plan. Patient is aware to call the clinic if they develop any new symptoms or if symptoms persist or worsen. Patient is aware when to return to the clinic for a follow-up visit. Patient educated on when it is appropriate to go to the emergency department.    I provided 12 minutes of time during this video encounter.   Marquett Bertoli [place] Thompson, DNP [facility] [address] [place], [place] [address] [phone] 10/02/2023

## 2023-10-07 ENCOUNTER — Ambulatory Visit: Payer: Self-pay

## 2023-10-07 NOTE — Telephone Encounter (Signed)
 Pt has appt

## 2023-10-07 NOTE — Progress Notes (Unsigned)
   Acute Office Visit  Subjective:     Patient ID: Virginia Black, female    DOB: 1965/07/30, 58 y.o.   MRN: [id number]  No chief complaint on file.   HPI  ROS Negative unless indicated in HPI    Objective:    There were no vitals taken for this visit. {Vitals History (Optional):23777}  Physical Exam Pertinent labs & imaging results that were available during my care of the patient were reviewed by me and considered in my medical decision making.  No results found for any visits on 10/08/23.      Assessment & Plan:  There are no diagnoses linked to this encounter.  No follow-ups on file.  @Ashford Clouse  Sebastian DNP@  Note: This document was prepared by [employer] voice dictation technology and any errors that results from this process are unintentional.

## 2023-10-07 NOTE — Telephone Encounter (Signed)
 FYI Only or Action Required?: FYI only for provider.  Patient was last seen in primary care on 10/02/2023 by Deitra Morton Sebastian Nena, NP.  Called Nurse Triage reporting Neck Pain.  Symptoms began several days ago.  Interventions attempted: OTC medications: Ibuprofen and Prescription medications: a muscle relaxer.  Symptoms are: unchanged.  Triage Disposition: See PCP When Office is Open (Within 3 Days)  Patient/caregiver understands and will follow disposition?: Yes        Copied from CRM #[id number]. Topic: Clinical - Red Word Triage >> Oct 07, 2023  9:30 AM Graeme ORN wrote: Red Word that prompted transfer to Nurse Triage: unbearable pain - already seen at urgent care pain has not stopped. Started in neck moved shoulder to elbow. Throbbing          Reason for Disposition  [1] MODERATE neck pain (e.g., interferes with normal activities) AND [2] present > 3 days  Answer Assessment - Initial Assessment Questions Patient seen at urgent care 3 days ago for the same.       1. ONSET: When did the pain begin?      5 days ago  2. LOCATION: Where does it hurt?      Right sided neck  3. PATTERN Does the pain come and go, or has it been constant since it started?      Constant  4. SEVERITY: How bad is the pain?  (Scale 0-10; or none or slight stiffness, mild, moderate, severe)     Moderate to severe  5. RADIATION: Does the pain go anywhere else, shoot into your arms?     Radiates to her right shoulder and arm.  6. CORD SYMPTOMS: Any weakness or numbness of the arms or legs?     No 7. CAUSE: What do you think is causing the neck pain?     Unsure  8. NECK OVERUSE: Any recent activities that involved turning or twisting the neck?     No 9. OTHER SYMPTOMS: Do you have any other symptoms? (e.g., headache, fever, chest pain, difficulty breathing, neck swelling)     No  Protocols used: Neck Pain or Stiffness-A-AH

## 2023-10-08 ENCOUNTER — Ambulatory Visit: Payer: Self-pay | Admitting: Nurse Practitioner

## 2023-10-08 ENCOUNTER — Ambulatory Visit (INDEPENDENT_AMBULATORY_CARE_PROVIDER_SITE_OTHER)

## 2023-10-08 ENCOUNTER — Other Ambulatory Visit: Payer: Self-pay | Admitting: Nurse Practitioner

## 2023-10-08 ENCOUNTER — Ambulatory Visit: Admitting: Nurse Practitioner

## 2023-10-08 ENCOUNTER — Encounter: Payer: Self-pay | Admitting: Nurse Practitioner

## 2023-10-08 VITALS — BP 113/69 | HR 91 | Temp 97.2°F | Ht 68.0 in | Wt 188.8 lb

## 2023-10-08 DIAGNOSIS — M25511 Pain in right shoulder: Secondary | ICD-10-CM

## 2023-10-08 DIAGNOSIS — M542 Cervicalgia: Secondary | ICD-10-CM

## 2023-10-08 MED ORDER — PREDNISONE 10 MG (21) PO TBPK: ORAL_TABLET | ORAL | 0 refills | Status: DC

## 2023-10-08 MED ORDER — METHOCARBAMOL 500 MG PO TABS: 500.0000 mg | ORAL_TABLET | Freq: Three times a day (TID) | ORAL | 0 refills | Status: DC | PRN

## 2023-10-08 MED ORDER — KETOROLAC TROMETHAMINE 30 MG/ML IJ SOLN
30.0000 mg | Freq: Once | INTRAMUSCULAR | Status: AC
  Administered 2023-10-08: 30 mg via INTRAMUSCULAR

## 2023-10-11 LAB — ALPHA-GAL PANEL
Allergen Lamb IgE: <0.1 kU/L
Beef IgE: <0.1 kU/L
IgE (Immunoglobulin E), Serum: 19 [IU]/mL (ref 6–495)
O215-IgE Alpha-Gal: <0.1 kU/L
Pork IgE: <0.1 kU/L

## 2023-11-06 ENCOUNTER — Ambulatory Visit
Admission: RE | Admit: 2023-11-06 | Discharge: 2023-11-06 | Disposition: A | Discharge status: Home / Self Care | Payer: Self-pay | Source: Ambulatory Visit | Attending: Nurse Practitioner | Admitting: Nurse Practitioner

## 2023-11-06 DIAGNOSIS — Z1231 Encounter for screening mammogram for malignant neoplasm of breast: Secondary | ICD-10-CM

## 2023-11-11 ENCOUNTER — Ambulatory Visit: Payer: Self-pay | Admitting: Nurse Practitioner

## 2023-12-17 NOTE — Progress Notes (Unsigned)
   Complete physical exam  Patient: Virginia Black   DOB: Jan 19, 1966   58 y.o. Female  MRN: [id number]  Subjective:    No chief complaint on file.   Virginia Black is a 58 y.o. female who presents today for a complete physical exam. She reports consuming a {diet types:17450} diet. {types:19826} She generally feels {DESC; WELL/FAIRLY WELL/POORLY:18703}. She reports sleeping {DESC; WELL/FAIRLY WELL/POORLY:18703}. She {does/does not:200015} have additional problems to discuss today.    Most recent fall risk assessment:    03/27/2023    8:59 AM  Fall Risk   Falls in the past year? 0     Most recent depression screenings:    03/27/2023    8:59 AM 07/06/2021    3:08 PM  PHQ 2/9 Scores  PHQ - 2 Score 0 0  PHQ- 9 Score  0    {VISON DENTAL STD PSA (Optional):27386}  {History (Optional):23778}  Patient Care Team: Deitra Morton Hummer, Nena, NP as PCP - General (Nurse Practitioner) Gorge Ade, MD as Consulting Physician (Obstetrics and Gynecology)   Outpatient Medications Prior to Visit  Medication Sig   buPROPion  (WELLBUTRIN  XL) 150 MG 24 hr tablet Take 1 tablet (150 mg total) by mouth daily.   DULoxetine  (CYMBALTA ) 60 MG capsule Take 1 capsule (60 mg total) by mouth daily.   loratadine (CLARITIN) 10 MG tablet Take by mouth.   methocarbamol  (ROBAXIN ) 500 MG tablet Take 1 tablet (500 mg total) by mouth every 8 (eight) hours as needed for muscle spasms.   Multiple Vitamin (THERA) TABS Take by mouth.   Omega-3 Fatty Acids (FISH OIL) 1000 MG CAPS Take by mouth.   predniSONE  (STERAPRED UNI-PAK 21 TAB) 10 MG (21) TBPK tablet Use as directed on back of pill pack   Turmeric 500 MG CAPS Take 1 capsule by mouth daily.   No facility-administered medications prior to visit.    ROS Negative unless indicated in HPI    Objective:     There were no vitals taken for this visit. {Vitals History (Optional):23777}  Physical Exam   No results found for any visits on 12/18/23. {Show  previous labs (optional):23779}    Assessment & Plan:    Routine Health Maintenance and Physical Exam  Discussed health benefits of physical activity, and encouraged her to engage in regular exercise appropriate for her age and condition.  There are no diagnoses linked to this encounter.  No follow-ups on file.     @Breena Bevacqua  [place], [place]

## 2023-12-18 ENCOUNTER — Encounter: Payer: Self-pay | Admitting: Nurse Practitioner

## 2023-12-18 ENCOUNTER — Ambulatory Visit (INDEPENDENT_AMBULATORY_CARE_PROVIDER_SITE_OTHER): Payer: Self-pay | Admitting: Nurse Practitioner

## 2023-12-18 VITALS — BP 110/60 | HR 79 | Temp 97.9°F | Ht 68.0 in | Wt 187.2 lb

## 2023-12-18 DIAGNOSIS — E663 Overweight: Secondary | ICD-10-CM

## 2023-12-18 DIAGNOSIS — Z23 Encounter for immunization: Secondary | ICD-10-CM

## 2023-12-18 DIAGNOSIS — E785 Hyperlipidemia, unspecified: Secondary | ICD-10-CM | POA: Diagnosis not present

## 2023-12-18 DIAGNOSIS — Z0000 Encounter for general adult medical examination without abnormal findings: Secondary | ICD-10-CM | POA: Insufficient documentation

## 2023-12-18 DIAGNOSIS — F419 Anxiety disorder, unspecified: Secondary | ICD-10-CM

## 2023-12-18 DIAGNOSIS — F339 Major depressive disorder, recurrent, unspecified: Secondary | ICD-10-CM | POA: Diagnosis not present

## 2023-12-18 DIAGNOSIS — Z0001 Encounter for general adult medical examination with abnormal findings: Secondary | ICD-10-CM | POA: Diagnosis not present

## 2023-12-18 DIAGNOSIS — Z6828 Body mass index (BMI) 28.0-28.9, adult: Secondary | ICD-10-CM

## 2023-12-18 LAB — LIPID PANEL
Chol/HDL Ratio: 3.3 ratio (ref 0.0–4.4)
Cholesterol, Total: 258 mg/dL — ABNORMAL HIGH (ref 100–199)
HDL: 78 mg/dL (ref >39)
LDL Chol Calc (NIH): 152 mg/dL — ABNORMAL HIGH (ref 0–99)
Triglycerides: 157 mg/dL — ABNORMAL HIGH (ref 0–149)
VLDL Cholesterol Cal: 28 mg/dL (ref 5–40)

## 2023-12-18 LAB — CMP14+EGFR
ALT: 17 IU/L (ref 0–32)
AST: 18 IU/L (ref 0–40)
Albumin: 4.1 g/dL (ref 3.8–4.9)
Alkaline Phosphatase: 93 IU/L (ref 49–135)
BUN/Creatinine Ratio: 23 (ref 9–23)
BUN: 17 mg/dL (ref 6–24)
Bilirubin Total: 0.3 mg/dL (ref 0.0–1.2)
CO2: 27 mmol/L (ref 20–29)
Calcium: 9 mg/dL (ref 8.7–10.2)
Chloride: 101 mmol/L (ref 96–106)
Creatinine, Ser: 0.73 mg/dL (ref 0.57–1.00)
Globulin, Total: 2.8 g/dL (ref 1.5–4.5)
Glucose: 99 mg/dL (ref 70–99)
Potassium: 5.2 mmol/L (ref 3.5–5.2)
Sodium: 142 mmol/L (ref 134–144)
Total Protein: 6.9 g/dL (ref 6.0–8.5)
eGFR: 95 mL/min/1.73 (ref >59)

## 2023-12-18 LAB — CBC WITH DIFFERENTIAL/PLATELET
Basophils Absolute: 0.1 x10E3/uL (ref 0.0–0.2)
Basos: 1 %
EOS (ABSOLUTE): 0.2 x10E3/uL (ref 0.0–0.4)
Eos: 3 %
Hematocrit: 39.7 % (ref 34.0–46.6)
Hemoglobin: 13.1 g/dL (ref 11.1–15.9)
Immature Grans (Abs): 0 x10E3/uL (ref 0.0–0.1)
Immature Granulocytes: 0 %
Lymphocytes Absolute: 2 x10E3/uL (ref 0.7–3.1)
Lymphs: 25 %
MCH: 30.8 pg (ref 26.6–33.0)
MCHC: 33 g/dL (ref 31.5–35.7)
MCV: 93 fL (ref 79–97)
Monocytes Absolute: 0.5 x10E3/uL (ref 0.1–0.9)
Monocytes: 7 %
Neutrophils Absolute: 5.1 x10E3/uL (ref 1.4–7.0)
Neutrophils: 64 %
Platelets: 413 x10E3/uL (ref 150–450)
RBC: 4.26 x10E6/uL (ref 3.77–5.28)
RDW: 12.4 % (ref 11.7–15.4)
WBC: 7.9 x10E3/uL (ref 3.4–10.8)

## 2023-12-18 LAB — TSH: TSH: 1.82 u[IU]/mL (ref 0.450–4.500)

## 2023-12-18 MED ORDER — DULOXETINE HCL 60 MG PO CPEP: 60.0000 mg | ORAL_CAPSULE | Freq: Every day | ORAL | 3 refills | Status: AC

## 2023-12-18 MED ORDER — BUPROPION HCL ER (XL) 150 MG PO TB24: 150.0000 mg | ORAL_TABLET | Freq: Every day | ORAL | 3 refills | Status: AC

## 2023-12-19 ENCOUNTER — Ambulatory Visit: Payer: Self-pay | Admitting: Nurse Practitioner

## 2024-02-27 ENCOUNTER — Encounter: Payer: Self-pay | Admitting: *Deleted

## 2024-03-02 ENCOUNTER — Telehealth: Payer: Self-pay | Admitting: Family Medicine

## 2024-03-02 NOTE — Telephone Encounter (Signed)
 Pt only wanted to see a female provider. [facility] had availability on same day as current appt for Transfer of Care so scheduled pt with [facility].

## 2024-03-02 NOTE — Telephone Encounter (Signed)
 Copied from CRM [id number]. Topic: Appointments - Scheduling Inquiry for Clinic >> Mar 02, 2024  9:30 AM Alfonso ORN wrote: Reason for CRM: pt recieved a message in mychart about her  phyiscal appt changed , her original physical was with her provider Deitra Morton Sebastian Nena NP at 9:00am on 12/23/24 , patient want to know why her appt is with Alcus Oneil ORN for her physical , patient prefer a female provider and would like to schedule with her provider    ----------------------------------------------------------------------- From previous Reason for Contact - Appt Info/Confirm: Patient/patient representative is calling for information regarding an appointment.

## 2024-12-23 ENCOUNTER — Encounter: Admitting: Family Medicine

## 2024-12-23 ENCOUNTER — Encounter: Admitting: Nurse Practitioner
